# Patient Record
Sex: Female | Born: 1960 | Race: White | Hispanic: No | Marital: Married | Smoking: Current every day smoker
Health system: Southern US, Community
[De-identification: ages and names within clinical notes are randomized; demographics above are authoritative.]

## PROBLEM LIST (undated history)

## (undated) DIAGNOSIS — J449 Chronic obstructive pulmonary disease, unspecified: Secondary | ICD-10-CM

## (undated) DIAGNOSIS — I2510 Atherosclerotic heart disease of native coronary artery without angina pectoris: Secondary | ICD-10-CM

## (undated) DIAGNOSIS — K219 Gastro-esophageal reflux disease without esophagitis: Secondary | ICD-10-CM

## (undated) DIAGNOSIS — Z720 Tobacco use: Secondary | ICD-10-CM

## (undated) DIAGNOSIS — Z972 Presence of dental prosthetic device (complete) (partial): Secondary | ICD-10-CM

## (undated) DIAGNOSIS — I10 Essential (primary) hypertension: Secondary | ICD-10-CM

## (undated) DIAGNOSIS — R7303 Prediabetes: Secondary | ICD-10-CM

## (undated) DIAGNOSIS — Z8489 Family history of other specified conditions: Secondary | ICD-10-CM

## (undated) DIAGNOSIS — K5090 Crohn's disease, unspecified, without complications: Secondary | ICD-10-CM

## (undated) DIAGNOSIS — E785 Hyperlipidemia, unspecified: Secondary | ICD-10-CM

## (undated) HISTORY — PX: CORONARY ANGIOPLASTY: SHX604

## (undated) HISTORY — DX: Essential (primary) hypertension: I10

## (undated) HISTORY — PX: PARTIAL HYSTERECTOMY: SHX80

## (undated) HISTORY — PX: APPENDECTOMY: SHX54

## (undated) HISTORY — DX: Tobacco use: Z72.0

## (undated) HISTORY — PX: CARDIAC CATHETERIZATION: SHX172

---

## 2010-04-24 ENCOUNTER — Emergency Department: Payer: Self-pay | Admitting: Emergency Medicine

## 2013-10-30 DIAGNOSIS — I219 Acute myocardial infarction, unspecified: Secondary | ICD-10-CM

## 2013-10-30 HISTORY — DX: Acute myocardial infarction, unspecified: I21.9

## 2013-11-24 ENCOUNTER — Emergency Department: Payer: Self-pay | Admitting: Emergency Medicine

## 2013-11-24 ENCOUNTER — Inpatient Hospital Stay (HOSPITAL_COMMUNITY)
Admission: AD | Admit: 2013-11-24 | Discharge: 2013-11-26 | DRG: 247 | Disposition: A | Discharge status: Home / Self Care | Payer: MEDICAID | Source: Other Acute Inpatient Hospital | Attending: Cardiovascular Disease | Admitting: Cardiovascular Disease

## 2013-11-24 ENCOUNTER — Encounter (HOSPITAL_COMMUNITY): Payer: Self-pay | Admitting: Cardiology

## 2013-11-24 DIAGNOSIS — F17200 Nicotine dependence, unspecified, uncomplicated: Secondary | ICD-10-CM | POA: Diagnosis present

## 2013-11-24 DIAGNOSIS — I214 Non-ST elevation (NSTEMI) myocardial infarction: Secondary | ICD-10-CM

## 2013-11-24 DIAGNOSIS — R7303 Prediabetes: Secondary | ICD-10-CM | POA: Diagnosis present

## 2013-11-24 DIAGNOSIS — E119 Type 2 diabetes mellitus without complications: Secondary | ICD-10-CM | POA: Diagnosis present

## 2013-11-24 DIAGNOSIS — I213 ST elevation (STEMI) myocardial infarction of unspecified site: Secondary | ICD-10-CM

## 2013-11-24 DIAGNOSIS — J4489 Other specified chronic obstructive pulmonary disease: Secondary | ICD-10-CM | POA: Diagnosis present

## 2013-11-24 DIAGNOSIS — B3731 Acute candidiasis of vulva and vagina: Secondary | ICD-10-CM | POA: Diagnosis present

## 2013-11-24 DIAGNOSIS — I2119 ST elevation (STEMI) myocardial infarction involving other coronary artery of inferior wall: Principal | ICD-10-CM | POA: Diagnosis present

## 2013-11-24 DIAGNOSIS — B373 Candidiasis of vulva and vagina: Secondary | ICD-10-CM | POA: Diagnosis present

## 2013-11-24 DIAGNOSIS — K5090 Crohn's disease, unspecified, without complications: Secondary | ICD-10-CM | POA: Diagnosis present

## 2013-11-24 DIAGNOSIS — J449 Chronic obstructive pulmonary disease, unspecified: Secondary | ICD-10-CM | POA: Diagnosis present

## 2013-11-24 DIAGNOSIS — I249 Acute ischemic heart disease, unspecified: Secondary | ICD-10-CM | POA: Diagnosis present

## 2013-11-24 DIAGNOSIS — I219 Acute myocardial infarction, unspecified: Secondary | ICD-10-CM

## 2013-11-24 DIAGNOSIS — I2589 Other forms of chronic ischemic heart disease: Secondary | ICD-10-CM | POA: Diagnosis present

## 2013-11-24 HISTORY — DX: Prediabetes: R73.03

## 2013-11-24 HISTORY — DX: Crohn's disease, unspecified, without complications: K50.90

## 2013-11-24 HISTORY — DX: Atherosclerotic heart disease of native coronary artery without angina pectoris: I25.10

## 2013-11-24 LAB — BASIC METABOLIC PANEL
ANION GAP: 9 (ref 7–16)
BUN: 10 mg/dL (ref 7–18)
CALCIUM: 9 mg/dL (ref 8.5–10.1)
CO2: 21 mmol/L (ref 21–32)
CREATININE: 1.07 mg/dL (ref 0.60–1.30)
Chloride: 109 mmol/L — ABNORMAL HIGH (ref 98–107)
EGFR (African American): >60
GFR CALC NON AF AMER: 59 — AB
GLUCOSE: 118 mg/dL — AB (ref 65–99)
OSMOLALITY: 278 (ref 275–301)
Potassium: 4.4 mmol/L (ref 3.5–5.1)
Sodium: 139 mmol/L (ref 136–145)

## 2013-11-24 LAB — URINALYSIS, ROUTINE W REFLEX MICROSCOPIC
BILIRUBIN URINE: NEGATIVE
Glucose, UA: NEGATIVE mg/dL
Ketones, ur: NEGATIVE mg/dL
Nitrite: NEGATIVE
Protein, ur: NEGATIVE mg/dL
Specific Gravity, Urine: 1.006 (ref 1.005–1.030)
UROBILINOGEN UA: 0.2 mg/dL (ref 0.0–1.0)
pH: 6 (ref 5.0–8.0)

## 2013-11-24 LAB — CK TOTAL AND CKMB (NOT AT ARMC)
CK TOTAL: 187 U/L — AB (ref 7–177)
CK, MB: 6.3 ng/mL (ref 0.3–4.0)
CK, MB: 23.6 ng/mL (ref 0.3–4.0)
Relative Index: INVALID (ref 0.0–2.5)
Relative Index: 12.6 — ABNORMAL HIGH (ref 0.0–2.5)
Total CK: 70 U/L (ref 7–177)

## 2013-11-24 LAB — CBC
HCT: 39 % (ref 35.0–47.0)
HGB: 13 g/dL (ref 12.0–16.0)
MCH: 29.7 pg (ref 26.0–34.0)
MCHC: 33.4 g/dL (ref 32.0–36.0)
MCV: 89 fL (ref 80–100)
Platelet: 288 10*3/uL (ref 150–440)
RBC: 4.38 10*6/uL (ref 3.80–5.20)
RDW: 15.9 % — AB (ref 11.5–14.5)
WBC: 15.3 10*3/uL — ABNORMAL HIGH (ref 3.6–11.0)

## 2013-11-24 LAB — TROPONIN I
TROPONIN I: 3.36 ng/mL — AB (ref <0.30)
Troponin I: 0.94 ng/mL (ref <0.30)
Troponin I: 4.03 ng/mL (ref <0.30)
Troponin-I: <0.02 ng/mL

## 2013-11-24 LAB — URINE MICROSCOPIC-ADD ON

## 2013-11-24 LAB — HEPARIN LEVEL (UNFRACTIONATED)

## 2013-11-24 LAB — PROTIME-INR
INR: 0.9
Prothrombin Time: 12.4 secs (ref 11.5–14.7)

## 2013-11-24 LAB — MRSA PCR SCREENING: MRSA by PCR: NEGATIVE

## 2013-11-24 LAB — HEMOGLOBIN A1C
HEMOGLOBIN A1C: 5.3 % (ref <5.7)
Mean Plasma Glucose: 105 mg/dL (ref <117)

## 2013-11-24 LAB — APTT: ACTIVATED PTT: 38.4 s — AB (ref 23.6–35.9)

## 2013-11-24 MED ORDER — ASPIRIN EC 81 MG PO TBEC: 81.0000 mg | DELAYED_RELEASE_TABLET | Freq: Every day | ORAL | Status: DC

## 2013-11-24 MED ORDER — SODIUM CHLORIDE 0.9 % IV SOLN: INTRAVENOUS | Status: DC

## 2013-11-24 MED ORDER — CLOTRIMAZOLE 2 % VA CREA
1.0000 | TOPICAL_CREAM | Freq: Every day | VAGINAL | Status: DC
  Filled 2013-11-24: qty 22.2

## 2013-11-24 MED ORDER — NITROGLYCERIN 0.4 MG SL SUBL: 0.4000 mg | SUBLINGUAL_TABLET | SUBLINGUAL | Status: DC | PRN

## 2013-11-24 MED ORDER — NITROGLYCERIN IN D5W 200-5 MCG/ML-% IV SOLN: 3.0000 ug/min | INTRAVENOUS | Status: DC

## 2013-11-24 MED ORDER — SODIUM CHLORIDE 0.9 % IV SOLN: 250.0000 mL | INTRAVENOUS | Status: DC | PRN

## 2013-11-24 MED ORDER — ATORVASTATIN CALCIUM 40 MG PO TABS
40.0000 mg | ORAL_TABLET | Freq: Every day | ORAL | Status: DC
  Administered 2013-11-24 – 2013-11-26 (×3): 40 mg via ORAL
  Filled 2013-11-24 (×3): qty 1

## 2013-11-24 MED ORDER — SODIUM CHLORIDE 0.9 % IV SOLN
INTRAVENOUS | Status: DC
  Administered 2013-11-24: 08:00:00 via INTRAVENOUS

## 2013-11-24 MED ORDER — NITROGLYCERIN IN D5W 200-5 MCG/ML-% IV SOLN
2.0000 ug/min | INTRAVENOUS | Status: DC
  Administered 2013-11-24: 10 ug/min via INTRAVENOUS
  Administered 2013-11-24: 5 ug/min via INTRAVENOUS

## 2013-11-24 MED ORDER — METOPROLOL TARTRATE 12.5 MG HALF TABLET
12.5000 mg | ORAL_TABLET | Freq: Three times a day (TID) | ORAL | Status: DC
  Administered 2013-11-25 – 2013-11-26 (×4): 12.5 mg via ORAL
  Filled 2013-11-24 (×9): qty 1

## 2013-11-24 MED ORDER — NITROGLYCERIN IN D5W 200-5 MCG/ML-% IV SOLN
INTRAVENOUS | Status: AC
  Filled 2013-11-24: qty 250

## 2013-11-24 MED ORDER — MORPHINE SULFATE 2 MG/ML IJ SOLN
2.0000 mg | INTRAMUSCULAR | Status: DC | PRN
  Administered 2013-11-25: 2 mg via INTRAVENOUS
  Filled 2013-11-24 (×2): qty 1

## 2013-11-24 MED ORDER — SODIUM CHLORIDE 0.9 % IJ SOLN: 3.0000 mL | INTRAMUSCULAR | Status: DC | PRN

## 2013-11-24 MED ORDER — SODIUM CHLORIDE 0.9 % IJ SOLN
3.0000 mL | Freq: Two times a day (BID) | INTRAMUSCULAR | Status: DC
  Administered 2013-11-24: 3 mL via INTRAVENOUS

## 2013-11-24 MED ORDER — NITROGLYCERIN 0.4 MG SL SUBL
SUBLINGUAL_TABLET | SUBLINGUAL | Status: AC
  Filled 2013-11-24: qty 25

## 2013-11-24 MED ORDER — CLOTRIMAZOLE 1 % VA CREA
1.0000 | TOPICAL_CREAM | Freq: Every day | VAGINAL | Status: DC
  Administered 2013-11-24 – 2013-11-25 (×2): 1 via VAGINAL
  Filled 2013-11-24: qty 45

## 2013-11-24 MED ORDER — DIAZEPAM 5 MG PO TABS
5.0000 mg | ORAL_TABLET | ORAL | Status: AC
  Administered 2013-11-25: 5 mg via ORAL
  Filled 2013-11-24: qty 1

## 2013-11-24 MED ORDER — MICONAZOLE NITRATE 2 % EX CREA
TOPICAL_CREAM | Freq: Two times a day (BID) | CUTANEOUS | Status: DC
Start: 2013-11-24 — End: 2013-11-26
  Administered 2013-11-24: 1 via TOPICAL
  Administered 2013-11-24 – 2013-11-25 (×2): via TOPICAL
  Filled 2013-11-24: qty 14

## 2013-11-24 MED ORDER — HEPARIN (PORCINE) IN NACL 100-0.45 UNIT/ML-% IJ SOLN
1200.0000 [IU]/h | INTRAMUSCULAR | Status: DC
  Administered 2013-11-24: 750 [IU]/h via INTRAVENOUS
  Filled 2013-11-24 (×2): qty 250

## 2013-11-24 MED ORDER — ACETAMINOPHEN 325 MG PO TABS
650.0000 mg | ORAL_TABLET | ORAL | Status: DC | PRN
  Administered 2013-11-24 (×2): 650 mg via ORAL
  Filled 2013-11-24 (×2): qty 2

## 2013-11-24 MED ORDER — ASPIRIN 81 MG PO CHEW
81.0000 mg | CHEWABLE_TABLET | ORAL | Status: AC
  Administered 2013-11-25: 81 mg via ORAL
  Filled 2013-11-24: qty 1

## 2013-11-24 MED ORDER — PANTOPRAZOLE SODIUM 40 MG PO TBEC
40.0000 mg | DELAYED_RELEASE_TABLET | Freq: Every day | ORAL | Status: DC
  Administered 2013-11-24 – 2013-11-26 (×3): 40 mg via ORAL
  Filled 2013-11-24 (×3): qty 1

## 2013-11-24 MED ORDER — SODIUM CHLORIDE 0.9 % IV SOLN
1.0000 mL/kg/h | INTRAVENOUS | Status: DC
  Administered 2013-11-25: 1 mL/kg/h via INTRAVENOUS

## 2013-11-24 NOTE — Progress Notes (Signed)
[facility] paged for trop value of  0.94

## 2013-11-24 NOTE — Progress Notes (Signed)
Patient's followup troponin was 0.94. Presentation therefore consistent with an acute coronary syndrome. Continue aspirin, heparin, beta blocker and statin. Continue nitroglycerin. She is pain-free at present. Followup electrocardiogram shows sinus bradycardia with no  ST elevation. Preliminary echocardiogram previously showed normal LV function and wall motion.Clinically she has reperfused following thrombolytic therapy. Therefore we will plan continue medical therapy and proceed with cardiac catheterization tomorrow morning. The risks and benefits were discussed and the patient agrees to proceed. We will proceed emergently if she develops recurrent symptoms despite therapy. [name]

## 2013-11-24 NOTE — Consult Note (Signed)
ANTICOAGULATION CONSULT NOTE - Initial Consult  Pharmacy Consult for heparin Indication: chest pain/ACS  No Known Allergies  Patient Measurements: Height: 5\' 2"  (157.5 cm) Weight: 158 lb 6.4 oz (71.85 kg) IBW/kg (Calculated) : 50.1 Heparin Dosing Weight: 65.4  Vital Signs: Temp: 97.7 F (36.5 C) (02/26 0800) Temp src: Oral (02/26 0800) BP: 103/65 mmHg (02/26 0910) Pulse Rate: 57 (02/26 0910)  Labs: No results found for this basename: HGB, HCT, PLT, APTT, LABPROT, INR, HEPARINUNFRC, CREATININE, CKTOTAL, CKMB, TROPONINI,  in the last 72 hours  CrCl is unknown because no creatinine reading has been taken.   Medical History: Past Medical History  Diagnosis Date  . Borderline type 2 diabetes mellitus 11/24/2013  . Crohn's disease 11/24/2013    Medications:  Scheduled:  . [START ON 11/25/2013] aspirin EC  81 mg Oral Daily  . atorvastatin  40 mg Oral q1800  . metoprolol tartrate  12.5 mg Oral TID  . nitroGLYCERIN      . pantoprazole  40 mg Oral Daily    Assessment: 53 yo F initially presented to [facility] with some inferior wall ST elevation, and was given TPA. Patient was subsequently transferred to [facility] with continued chest pain. Pharmacy has been consulted to dose heparin for ACS.  Will remain conservative in dosing as patient recently received a thrombolytic. Will aim for a lower goal today (0.3-0.5) and adjust per levels, though as time progresses from TPA administration, we can aim for the typical goal of 0.3 - 0.7. (Fibrinolytic activity persists for up to 1 hour after infusion ends per LexiComp).    No CBC or BMET available.  Goal of Therapy:  Tentatively will aim for heparin level 0.3-0.5 units/mL Heparin level 0.3-0.7 units/ml Monitor platelets by anticoagulation protocol: Yes   Plan:  -Start heparin without bolus at 750 units/hr (~11.4 units/kg/hr using dosing weight) -Check 8 hour heparin level (since no bolus given) at 1730 -Daily HL, CBC  while on heparin -F/u bleeding complications, cardiology plans  Edouard Gikas C. Hamna Asa, PharmD Clinical Pharmacist-Resident Pager: [phone] Pharmacy: [phone] 11/24/2013 9:31 AM

## 2013-11-24 NOTE — Progress Notes (Signed)
Pt and spouse  are watching cardiac cath video ( # 115) .

## 2013-11-24 NOTE — Progress Notes (Signed)
  Echocardiogram 2D Echocardiogram has been performed.  [facility] 11/24/2013, 8:48 AM

## 2013-11-24 NOTE — Progress Notes (Addendum)
Troponin value = 3.36, CK-MB = 23.6 , total CK  = 187 . Pt sleeping . B/P = 89/58(68) . HR sustained at 56 (SR) .SpO2 = 97% on  RA .  Pt had been denying chest discomfort . Eating well . Voided x 3 this shift iIn  [facility] . [name] ,NP , paged .

## 2013-11-24 NOTE — Progress Notes (Signed)
For vag. Yeast infections that pt has been having will add monistat vag suppos. And ointment to groin.  Will check hgBA1c, for freq yeast inf. And borderline diabetes.

## 2013-11-24 NOTE — Progress Notes (Signed)
Pt c/o having yeast infection ." I've been getting them a lot . Dr told me to change soaps . The Dr  Usually gives me a pill to take ." Lady Saucier , paged .

## 2013-11-24 NOTE — Progress Notes (Signed)
Pt had watched cardiac cath video for the 2nd time . Instructions for pre and post procedure also verbally reviewed. the patient asked appropriate questions prior to signing consent form.

## 2013-11-24 NOTE — H&P (Signed)
[facility] Hall is an 53 y.o. female.    Primary Cardiologist:NEW  Default, Provider, MD  Chief Complaint: Chest pain   HPI: 53 year old female with 6 month hx of episodic chest pain but increased this AM with radiation down arm.  She went to [facility] and had some inf wall ST elevation and rec'd TPA.  Associated symptoms were nausea and diaphoresis.   CXR with atelectasis.   Labs: WBC 15.3, hgb 13, hct 39,  Cr. 1.07   [facility] and here with continued chest pain.  On arrival BP 179/100 and IV NTG started.  She has 5/10 chest pain.    Past Medical History  Diagnosis Date  . Borderline type 2 diabetes mellitus 11/24/2013  . Crohn's disease 11/24/2013    Past Surgical History  Procedure Laterality Date  . Partial hysterectomy    . Appendectomy    . Cesarean section      X 2    Family History  Problem Relation Age of Onset  . Hypertension Mother   . Diabetes Mother   . Stroke Maternal Grandfather    Social History:  reports that she has been smoking Cigarettes.  She has been smoking about 0.00 packs per day. She has never used smokeless tobacco. She reports that she does not drink alcohol or use illicit drugs.  Allergies: No Known Allergies  OUTPATIENT Meds:   ASA  No results found for this or any previous visit (from the past 48 hour(s)). No results found.  ROS: General:no colds or fevers, no weight changes Skin:no rashes or ulcers HEENT:no blurred vision, no congestion CV:see HPI PUL:see HPI GI:no diarrhea constipation occ melena with her Crohns, no indigestion GU:no hematuria, no dysuria MS:no joint pain, no claudication, + rt foot pain yesterday, had trouble walking Neuro:no syncope, no lightheadedness Endo:borderline diabetes, no thyroid disease   Blood pressure 153/90, pulse 58, temperature 97.5 F (36.4 C), temperature source Oral, resp. rate 17, height 5\' 2"  (1.575 m), weight 158 lb 6.4 oz (71.85 kg), SpO2 99.00%. PE:  General:anxious, in pain, some sedation Skin:Warm and dry, brisk capillary refill HEENT:normocephalic, sclera clear, mucus membranes moist Neck:supple, no JVD, no bruits  Heart:S1S2 RRR without murmur, gallup, rub or click Lungs:clear without rales, rhonchi, or wheezes XBJ:YNWGAbd:soft, non tender, + BS, do not palpate liver spleen or masses Ext:no lower ext edema, 2+ pedal pulses, 2+ radial pulses Neuro:alert and oriented, MAE, follows commands, + facial symmetry    Assessment/Plan Principal Problem:   STEMI (ST elevation myocardial infarction), inf wall Active Problems:   Borderline type 2 diabetes mellitus   Crohn's disease   Plan: stat echo, enzymes.  On NTGNada Boozer.    [name],[name] Nurse Practitioner Certified [facility] Pager [phone] or after 5pm or weekends call [phone] 11/24/2013, 8:11 AM    As above, patient seen and examined. Briefly she is a 53 year old female with a past medical history of COPD, borderline diabetes mellitus, Crohn's disease transferred from [facility] with possible inferior myocardial infarction. No prior cardiac history. Patient states she has had occasional chest pain since beginning Chantix 6 months ago ([facility] The pain occurs both with exertion and at rest. It is in the left breast area radiating to the neck and left upper extremity. Lasts 10 minutes and resolves spontaneously. Developed recurrent chest pain at approximately 2 AM. She describes the pain as sharp and dull radiating to neck  and left upper extremity. Increases with certain movements. There is associated nausea, diaphoresis and dyspnea. Went to [facility]. Patient was seen in the emergency room and by report electrocardiogram was felt to be consistent with an acute inferior myocardial infarction. Because of the weather she was unable to be transferred immediately to [facility]. Because of the potential delay in transfer she was treated with  thrombolytic therapy. She was ultimately able to be transferred by EMS and arrived at [facility] at approximately 6:30 AM. On arrival she was having chest pain but then symptoms resolved. Exam shows 2 + pulses in all ext; chest CTA; CV RRR with no murmurs. I have reviewed electrocardiograms. There appears to be slight ST elevation inferiorly but I am not convinced it is diagnostic of an acute infarct. Her pain is also somewhat unusual as it increases with certain movements. Question musculoskeletal. I would be hesitant to proceed with cardiac catheterization at this point given uncertainty of diagnosis and the risk of bleeding in the lytic state. We will cycle enzymes. If abnormal we'll plan to proceed with cardiac catheterization tomorrow morning. If normal would ultimately plan stress nuclear study. Quick look echocardiogram showed normal LV function and no pericardial effusion. No wall motion abnormalities. Will discontinue heparin if followup enzymes negative. Watch for bleeding. Discontinue tobacco use. [name]

## 2013-11-24 NOTE — Plan of Care (Signed)
Problem: Consults Goal: Chest Pain Patient Education (See Patient Education module for education specifics.) Outcome: Progressing Pt watched cardiac cath video

## 2013-11-24 NOTE — Care Management Note (Addendum)
    Page 1 of 1   11/25/2013     2:20:43 PM   CARE MANAGEMENT NOTE 11/25/2013  Patient:  DEMIKA, MARIAS   Account Number:  [id number]  Date Initiated:  11/24/2013  Documentation initiated by:  Junius Creamer  Subjective/Objective Assessment:   adm w st changes     Action/Plan:   lives w husband   Anticipated DC Date:     Anticipated DC Plan:        DC Planning Services  CM consult  Medication Assistance      Choice offered to / List presented to:             Status of service:   Medicare Important Message given?   (If response is "NO", the following Medicare IM given date fields will be blank) Date Medicare IM given:   Date Additional Medicare IM given:    Discharge Disposition:    Per UR Regulation:  Reviewed for med. necessity/level of care/duration of stay  If discussed at Long Length of Stay Meetings, dates discussed:    Comments:  2/27 1420 debbie Inaara Tye rn,bsn left pt 30day free and copay assist card for effient.

## 2013-11-24 NOTE — Progress Notes (Signed)
11/24/13  Pharmacy- Heparin 1920  Heparin level < 0.1 (goal 0.3-0.5 x 1st 24hr s/p tPA)  A/P:  53yo female with ACS, s/p tPA at [facility] last night.  Heparin level is sub-therapeutic, no problems with IV nor with IV pump.  No bleeding issues per d/w RN.    1.  Increase heparin to 900 units/hr 2.  Heparin level in 6hr  Marisue Humble, PharmD Clinical Pharmacist [facility]

## 2013-11-25 ENCOUNTER — Encounter (HOSPITAL_COMMUNITY)
Admission: AD | Disposition: A | Discharge status: Home / Self Care | Payer: Self-pay | Source: Other Acute Inpatient Hospital | Attending: Cardiovascular Disease

## 2013-11-25 ENCOUNTER — Other Ambulatory Visit: Payer: Self-pay

## 2013-11-25 ENCOUNTER — Encounter (HOSPITAL_COMMUNITY): Payer: Self-pay | Admitting: Interventional Cardiology

## 2013-11-25 DIAGNOSIS — I200 Unstable angina: Secondary | ICD-10-CM

## 2013-11-25 DIAGNOSIS — E119 Type 2 diabetes mellitus without complications: Secondary | ICD-10-CM

## 2013-11-25 DIAGNOSIS — I2119 ST elevation (STEMI) myocardial infarction involving other coronary artery of inferior wall: Principal | ICD-10-CM

## 2013-11-25 DIAGNOSIS — I219 Acute myocardial infarction, unspecified: Secondary | ICD-10-CM

## 2013-11-25 DIAGNOSIS — I2510 Atherosclerotic heart disease of native coronary artery without angina pectoris: Secondary | ICD-10-CM

## 2013-11-25 HISTORY — PX: LEFT HEART CATHETERIZATION WITH CORONARY ANGIOGRAM: SHX5451

## 2013-11-25 HISTORY — PX: PERCUTANEOUS CORONARY STENT INTERVENTION (PCI-S): SHX5485

## 2013-11-25 HISTORY — PX: FRACTIONAL FLOW RESERVE WIRE: SHX5839

## 2013-11-25 LAB — HEPARIN LEVEL (UNFRACTIONATED)

## 2013-11-25 LAB — BASIC METABOLIC PANEL
BUN: 11 mg/dL (ref 6–23)
CHLORIDE: 108 meq/L (ref 96–112)
CO2: 23 meq/L (ref 19–32)
Calcium: 8.4 mg/dL (ref 8.4–10.5)
Creatinine, Ser: 0.76 mg/dL (ref 0.50–1.10)
GFR calc non Af Amer: >90 mL/min (ref >90)
Glucose, Bld: 107 mg/dL — ABNORMAL HIGH (ref 70–99)
Potassium: 4 mEq/L (ref 3.7–5.3)
Sodium: 143 mEq/L (ref 137–147)

## 2013-11-25 LAB — POCT ACTIVATED CLOTTING TIME
ACTIVATED CLOTTING TIME: 232 s
ACTIVATED CLOTTING TIME: 298 s
Activated Clotting Time: 249 seconds
Activated Clotting Time: 260 seconds

## 2013-11-25 LAB — LIPID PANEL
CHOL/HDL RATIO: 4.9 ratio
Cholesterol: 148 mg/dL (ref 0–200)
Cholesterol: 151 mg/dL (ref 0–200)
HDL: 30 mg/dL — ABNORMAL LOW (ref >39)
HDL: 31 mg/dL — AB (ref >39)
LDL CALC: 73 mg/dL (ref 0–99)
LDL Cholesterol: 71 mg/dL (ref 0–99)
TRIGLYCERIDES: 234 mg/dL — AB (ref <150)
Total CHOL/HDL Ratio: 4.9 RATIO
Triglycerides: 236 mg/dL — ABNORMAL HIGH (ref <150)
VLDL: 47 mg/dL — AB (ref 0–40)
VLDL: 47 mg/dL — ABNORMAL HIGH (ref 0–40)

## 2013-11-25 LAB — CBC
HCT: 33.3 % — ABNORMAL LOW (ref 36.0–46.0)
Hemoglobin: 11.3 g/dL — ABNORMAL LOW (ref 12.0–15.0)
MCH: 30.1 pg (ref 26.0–34.0)
MCHC: 33.9 g/dL (ref 30.0–36.0)
MCV: 88.6 fL (ref 78.0–100.0)
Platelets: 244 10*3/uL (ref 150–400)
RBC: 3.76 MIL/uL — AB (ref 3.87–5.11)
RDW: 14.8 % (ref 11.5–15.5)
WBC: 8.4 10*3/uL (ref 4.0–10.5)

## 2013-11-25 LAB — PROTIME-INR
INR: 0.99 (ref 0.00–1.49)
PROTHROMBIN TIME: 12.9 s (ref 11.6–15.2)

## 2013-11-25 LAB — PLATELET COUNT: Platelets: 265 10*3/uL (ref 150–400)

## 2013-11-25 SURGERY — LEFT HEART CATHETERIZATION WITH CORONARY ANGIOGRAM
Anesthesia: LOCAL | Site: Hand | Laterality: Right

## 2013-11-25 MED ORDER — TIROFIBAN HCL IV 5 MG/100ML
0.1500 ug/kg/min | INTRAVENOUS | Status: AC
  Filled 2013-11-25: qty 100

## 2013-11-25 MED ORDER — HEPARIN (PORCINE) IN NACL 2-0.9 UNIT/ML-% IJ SOLN
INTRAMUSCULAR | Status: AC
  Filled 2013-11-25: qty 1500

## 2013-11-25 MED ORDER — ACETAMINOPHEN 500 MG PO TABS
500.0000 mg | ORAL_TABLET | Freq: Four times a day (QID) | ORAL | Status: DC | PRN
  Administered 2013-11-25: 500 mg via ORAL
  Filled 2013-11-25: qty 1

## 2013-11-25 MED ORDER — LIDOCAINE HCL (PF) 1 % IJ SOLN
INTRAMUSCULAR | Status: AC
  Filled 2013-11-25: qty 30

## 2013-11-25 MED ORDER — VERAPAMIL HCL 2.5 MG/ML IV SOLN
INTRAVENOUS | Status: AC
  Filled 2013-11-25: qty 2

## 2013-11-25 MED ORDER — PRASUGREL HCL 10 MG PO TABS
ORAL_TABLET | ORAL | Status: AC
  Filled 2013-11-25: qty 6

## 2013-11-25 MED ORDER — MIDAZOLAM HCL 2 MG/2ML IJ SOLN
INTRAMUSCULAR | Status: AC
  Filled 2013-11-25: qty 2

## 2013-11-25 MED ORDER — ASPIRIN EC 81 MG PO TBEC: 81.0000 mg | DELAYED_RELEASE_TABLET | Freq: Every day | ORAL | Status: DC

## 2013-11-25 MED ORDER — NITROGLYCERIN 0.2 MG/ML ON CALL CATH LAB
INTRAVENOUS | Status: AC
  Filled 2013-11-25: qty 1

## 2013-11-25 MED ORDER — FENTANYL CITRATE 0.05 MG/ML IJ SOLN
INTRAMUSCULAR | Status: AC
  Filled 2013-11-25: qty 2

## 2013-11-25 MED ORDER — ASPIRIN 325 MG PO TABS
325.0000 mg | ORAL_TABLET | Freq: Every day | ORAL | Status: DC
  Administered 2013-11-26: 325 mg via ORAL
  Filled 2013-11-25 (×2): qty 1

## 2013-11-25 MED ORDER — TIROFIBAN HCL IV 12.5 MG/250 ML
INTRAVENOUS | Status: AC
  Filled 2013-11-25: qty 250

## 2013-11-25 MED ORDER — HEPARIN SODIUM (PORCINE) 1000 UNIT/ML IJ SOLN
INTRAMUSCULAR | Status: AC
  Filled 2013-11-25: qty 1

## 2013-11-25 MED ORDER — SODIUM CHLORIDE 0.9 % IV SOLN: 1.0000 mL/kg/h | INTRAVENOUS | Status: AC

## 2013-11-25 MED ORDER — ASPIRIN 81 MG PO CHEW: 81.0000 mg | CHEWABLE_TABLET | Freq: Every day | ORAL | Status: DC

## 2013-11-25 MED ORDER — ADENOSINE 12 MG/4ML IV SOLN
12.0000 mL | INTRAVENOUS | Status: AC
  Administered 2013-11-25: 36 mg via INTRAVENOUS
  Filled 2013-11-25: qty 12

## 2013-11-25 MED ORDER — PRASUGREL HCL 10 MG PO TABS
10.0000 mg | ORAL_TABLET | Freq: Every day | ORAL | Status: DC
  Administered 2013-11-25 – 2013-11-26 (×2): 10 mg via ORAL
  Filled 2013-11-25 (×2): qty 1

## 2013-11-25 NOTE — Progress Notes (Signed)
Subjective: [name] is a 53 yo F pmh COPD, Crohns, DM transfer from [facility] w/STEMI s/p TPA.   This AM pt is w/o CP, SOB, DOE anxious for LHC on 11/25/13. More history pt started smoking at age 34 and up to 4ppd at some years but has been trying to quit and down to 0.5ppd currently. Pt has never had any periods of smoking cessation.   Objective: Vital signs in last 24 hours: Filed Vitals:   11/25/13 0400 11/25/13 0500 11/25/13 0600 11/25/13 0700  BP: 117/65 98/58 95/53  104/68  Pulse: 84     Temp: 98.2 F (36.8 C)     TempSrc: Oral     Resp: 14     Height:      Weight:   156 lb 8.4 oz (71 kg)   SpO2: 100%      Weight change: -1 lb 14 oz (-0.85 kg)  Intake/Output Summary (Last 24 hours) at 11/25/13 0733 Last data filed at 11/25/13 0650  Gross per 24 hour  Intake 2005.53 ml  Output   3375 ml  Net -1369.47 ml   General: resting in bed HEENT: PERRL, EOMI, no scleral icterus Cardiac: RRR, no rubs, murmurs or gallops Pulm: clear to auscultation bilaterally, moving normal volumes of air Abd: soft, nontender, nondistended, BS present Ext: warm and well perfused, no pedal edema Neuro: alert and oriented X3, cranial nerves II-XII grossly intact  Lab Results: Basic Metabolic Panel:  Recent Labs Lab 11/25/13 0210  NA 143  K 4.0  CL 108  CO2 23  GLUCOSE 107*  BUN 11  CREATININE 0.76  CALCIUM 8.4   CBC:  Recent Labs Lab 11/25/13 0210  WBC 8.4  HGB 11.3*  HCT 33.3*  MCV 88.6  PLT 244   Cardiac Enzymes:  Recent Labs Lab 11/24/13 0818 11/24/13 1410 11/24/13 2120  CKTOTAL 70 187*  --   CKMB 6.3* 23.6*  --   TROPONINI 0.94* 3.36* 4.03*   Hemoglobin A1C:  Recent Labs Lab 11/24/13 1700  HGBA1C 5.3   Fasting Lipid Panel:  Recent Labs Lab 11/25/13 0210  CHOL 151  148  HDL 31*  30*  LDLCALC 73  71  TRIG 234*  236*  CHOLHDL 4.9  4.9   Coagulation:  Recent Labs Lab 11/25/13 0210  LABPROT 12.9  INR 0.99   Urinalysis:  Recent  Labs Lab 11/24/13 1730  COLORURINE YELLOW  LABSPEC 1.006  PHURINE 6.0  GLUCOSEU NEGATIVE  HGBUR SMALL*  BILIRUBINUR NEGATIVE  KETONESUR NEGATIVE  PROTEINUR NEGATIVE  UROBILINOGEN 0.2  NITRITE NEGATIVE  LEUKOCYTESUR LARGE*   Cardiac Studies: Echo 11/24/13: EF 40-45%, Possible mild hypokinesis of the basalinferior myocardium. Left ventricular diastolic function parameters were normal.   Medications: I have reviewed the patient's current medications. Scheduled Meds: . [START ON 11/26/2013] aspirin EC  81 mg Oral Daily  . atorvastatin  40 mg Oral q1800  . clotrimazole  1 Applicatorful Vaginal QHS  . metoprolol tartrate  12.5 mg Oral TID  . miconazole   Topical BID  . pantoprazole  40 mg Oral Daily  . sodium chloride  3 mL Intravenous Q12H   Continuous Infusions: . sodium chloride Stopped (11/24/13 2000)  . sodium chloride    . sodium chloride 1 mL/kg/hr (11/25/13 0654)  . heparin Stopped (11/25/13 0650)  . nitroGLYCERIN 10 mcg/min (11/24/13 0752)  . nitroGLYCERIN 5 mcg/min (11/24/13 0840)   PRN Meds:.sodium chloride, acetaminophen, morphine injection, nitroGLYCERIN, sodium chloride  Echo 11/24/2013  Left ventricle: The  cavity size was normal. Wall thickness was normal. Systolic function was mildly to moderately reduced. The estimated ejection fraction was in the range of 40% to 45%. Possible mild hypokinesis of the basalinferior myocardium. Left ventricular diastolic function parameters were normal.    Assessment/Plan:  1. Acute Coronary Syndrome: Pt p/w ongoing CP, suspecis EKG findings of inferior MI, trop elevation (peak 4.03).  -LHC 11/25/13 -ASA, metoprolol, statin  2. Crohn's Disease w/o flare  3. DM type 2: Pt HgbA1c 5.3 therefore pre-DM. Life style changes advised.   4. Vaginal candidiasis: on monistat therapy   5. Tobacco abuse: smoking cessation education and access to materials provided to pt. May consider using Chantix again pt will address possibility  with PCP.    LOS: 1 day   Alejandra BameNora Sadek, MD 11/25/2013, 7:33 AM   The patient was seen, examined and discussed with Alejandra BameNora Sadek, MD and I agree with the above.   In summary , a 53 year old female who presented on 11/23/13 with STEMI, s/p TPA, but worsening troponin, mas troponin 4.03 and rising, cath is scheduled for today. Echo showed LVEF 40-45% with regional wall motion abnormalities. We will continue metoprolol, ASA, add atorvastatin, add ACEI after cath if tolerated by BP.   [facility], [facility], [facility] 11/25/2013

## 2013-11-25 NOTE — CV Procedure (Addendum)
PROCEDURE:  Left heart catheterization with selective coronary angiography, left ventriculogram.  FFR of the LAD. PCI of the LAD. PCI of the RCA.  INDICATIONS:  Acute inferior myocardial infarction, treated initially with thrombolytics  The risks, benefits, and details of the procedure were explained to the patient.  The patient verbalized understanding and wanted to proceed.  Informed written consent was obtained.  PROCEDURE TECHNIQUE:  After Xylocaine anesthesia a 14F slender sheath was placed in the right radial artery with a single anterior needle wall stick.   Intravenous heparin was administered. Right coronary angiography was done using a Judkins R4 guide catheter.  Left coronary angiography was done using a Judkins L3.5 guide catheter.  Left ventriculography was done using a pigtail catheter. Pressure wire was performed with additional heparin. Intravenous adenosine was given as well for the pressure wire. Intervention was performed after tirofiban was given. Please see below for details. A TR band was used for hemostasis.   CONTRAST:  Total of 230 cc.  COMPLICATIONS:  None.    HEMODYNAMICS:  Aortic pressure was 105/63; LV pressure was 109/4; LVEDP 12.  There was no gradient between the left ventricle and aorta.    ANGIOGRAPHIC DATA:   The left main coronary artery is widely patent.  The left anterior descending artery is a large vessel which wraps around the apex. In the proximal vessel, there is an eccentric 60-70% stenosis. There is mild disease in the mid vessel. There is a large diagonal branch which has mild disease. The remainder of the mid to distal LAD past the diagonal is widely patent with only mild atherosclerosis.    The left circumflex artery is a large vessel proximally. The first obtuse marginal is small but widely patent. The second obtuse marginal is patent proximally. There is an aneurysmal segment just before bifurcation. The more anterior pole is widely  patent. The more lateral pole is occluded proximally. This fills by left to left collaterals. The vessel appears small in caliber but is fairly long.  The right coronary artery is a large dominant vessel. There is moderate disease proximally. In the mid vessel, there is moderate to severe diffuse disease. There is a focal 70% stenosis, followed by a tortuous segment. After the tortuous segment, there is a focal 90% stenosis. The distal RCA appears widely patent. The posterior descending artery is large and widely patent. The posterior lateral artery is medium size and widely patent.  LEFT VENTRICULOGRAM:  Left ventricular angiogram was done in the 30 RAO projection and revealed normal left ventricular wall motion and systolic function with an estimated ejection fraction of 60 %.  LVEDP was 12 mmHg.  PCI NARRATIVE: A CLS 3.0 guiding catheter was used to engage the left main. Additional IV heparin was given for anticoagulation. An ACT was used to check that the anticoagulation was therapeutic. A pressure wire was placed across the area disease in the proximal LAD.  IV adenosine was given. The resting FFR was 0.94. The FFR after adenosine was 0.80.  At that point, it was unclear whether the occluded OM branch was the culprit for her infarct. The pressure wire was removed and a pro-water wire was advanced to the circumflex. We attempted to cross the occlusion in the OM but this would not cross. 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 The pro-water wire was then directed down the LAD. The proximal LAD lesion which had been found significant by pressure wire  was direct stented with a 3.5 x 12 from his drug-eluting stent, postdilated with a 3.75 x 8 noncompliant balloon. Intracoronary nitroglycerin was administered.  There was an excellent angiographic result.  Attention was then turned to the RCA. Initially, a Williams right guiding catheter was used. The wire was placed down the  vessel despite the fact that the [facility] right did not quite reach the ostium. Even with the wire across the lesion, the guide could not be pulled in. We switched to an Akari right 1.0 guide.  A Fielder XT wire was placed across the area disease in the RCA. A 2.0 x 20 balloon was advanced but would not cross. A pro-water wire was then placed across the area disease as a buddy wire. A 2.0 balloon did advance. This was used to predilate the RCA.  We tried to advance a 2.5 x 38 promus drug-eluting stent, but this would not advance.  A guideline or was then advanced with a 2.0 x 12 balloon in front of it. This was advanced to the mid RCA. It went into position in the mid RCA. It was deployed at high pressure. The guideliner was removed.  There is difficulty in getting a post dilatation balloon to advance to post-dilate the distal stent. Several balloons were tried. The proximal area to the stent also had moderate disease. This was subsequently stented with a 2.75 x 16 promise drug-eluting stent in overlapping fashion. This area was eventually postdilated with a 3.25 noncompliant balloon; however this balloon did not advance to the distal vessel. A grand slam wire was used as a buddy wire without success. Eventually, another pro-water wire was placed. A 3.0 x 8 noncompliant balloon was advanced to the distal stented area. After significant effort, the entire stented segment was post dilated. The postdilatation balloons were inflated to high pressure. There is an excellent angiographic result.  IMPRESSIONS:  1. Widely patent left main coronary artery. 2. Successful PCI of the proximal left anterior descending artery 3.5 x 12 Promus drug-eluting stent, postdilated to 3.8 mm in diameter 3. Patent left circumflex artery.  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 4. Successful, but complicated PCI of right coronary artery, requiring guideliner, with a 2.5 x 38 Promus drug-eluting  stent, overlapping with a 2.75 x 16 promus drug-eluting stent more proximally. The entire stented segment was post dilated with a 3.0  noncompliant balloon distally and a 3.25 noncompliant balloon more proximally. 5. Normal left ventricular systolic function.  LVEDP 12 mmHg.  Ejection fraction 60%.  RECOMMENDATION:  Dual antiplatelet therapy for at least a year. The patient be watched overnight in the step down unit. Possible discharge in the next one to 2 days depending on how she does and how ambulatory she is without chest pain. Continue aggressive secondary prevention.

## 2013-11-25 NOTE — H&P (View-Only) (Signed)
Patient's followup troponin was 0.94. Presentation therefore consistent with an acute coronary syndrome. Continue aspirin, heparin, beta blocker and statin. Continue nitroglycerin. She is pain-free at present. Followup electrocardiogram shows sinus bradycardia with no  ST elevation. Preliminary echocardiogram previously showed normal LV function and wall motion.Clinically she has reperfused following thrombolytic therapy. Therefore we will plan continue medical therapy and proceed with cardiac catheterization tomorrow morning. The risks and benefits were discussed and the patient agrees to proceed. We will proceed emergently if she develops recurrent symptoms despite therapy. [facility]

## 2013-11-25 NOTE — Interval H&P Note (Signed)
Cath Lab Visit (complete for each Cath Lab visit)  Clinical Evaluation Leading to the Procedure:   ACS: yes  Non-ACS:    Anginal Classification: CCS IV  Anti-ischemic medical therapy: Minimal Therapy (1 class of medications)  Non-Invasive Test Results: No non-invasive testing performed  Prior CABG: No previous CABG      History and Physical Interval Note:  11/25/2013 8:37 AM  [facility]  has presented today for surgery, with the diagnosis of cp  The various methods of treatment have been discussed with the patient and family. After consideration of risks, benefits and other options for treatment, the patient has consented to  Procedure(s): LEFT HEART CATHETERIZATION WITH CORONARY ANGIOGRAM (N/A) as a surgical intervention .  The patient's history has been reviewed, patient examined, no change in status, stable for surgery.  I have reviewed the patient's chart and labs.  Questions were answered to the patient's satisfaction.     Kajuana Shareef S.

## 2013-11-25 NOTE — Progress Notes (Signed)
ANTICOAGULATION CONSULT NOTE - Follow Up Consult  Pharmacy Consult for heparin Indication: chest pain/ACS  Labs:  Recent Labs  11/24/13 0818 11/24/13 1410 11/24/13 1700 11/24/13 2120 11/25/13 0210  HGB  --   --   --   --  11.3*  HCT  --   --   --   --  33.3*  PLT  --   --   --   --  244  LABPROT  --   --   --   --  12.9  INR  --   --   --   --  0.99  HEPARINUNFRC  --   --  <0.10*  --  <0.10*  CREATININE  --   --   --   --  0.76  CKTOTAL 70 187*  --   --   --   CKMB 6.3* 23.6*  --   --   --   TROPONINI 0.94* 3.36*  --  4.03*  --     Assessment: 52yo female remains undetectable on heparin despite rate increase w/ conservative dosing given tPA at OSH, now >24hr since administration and can increase goal.  Goal of Therapy:  Heparin level 0.3-0.7 units/ml   Plan:  Will increase heparin gtt by 4 units/kg/hr to 1200 units/hr and check level in 6hr.  Vernard Gambles, PharmD, BCPS  11/25/2013,4:42 AM

## 2013-11-26 ENCOUNTER — Encounter (HOSPITAL_COMMUNITY): Payer: Self-pay | Admitting: Cardiology

## 2013-11-26 LAB — CBC
HCT: 31.8 % — ABNORMAL LOW (ref 36.0–46.0)
Hemoglobin: 10.9 g/dL — ABNORMAL LOW (ref 12.0–15.0)
MCH: 30.3 pg (ref 26.0–34.0)
MCHC: 34.3 g/dL (ref 30.0–36.0)
MCV: 88.3 fL (ref 78.0–100.0)
PLATELETS: 236 10*3/uL (ref 150–400)
RBC: 3.6 MIL/uL — AB (ref 3.87–5.11)
RDW: 14.7 % (ref 11.5–15.5)
WBC: 10.7 10*3/uL — AB (ref 4.0–10.5)

## 2013-11-26 LAB — BASIC METABOLIC PANEL
BUN: 12 mg/dL (ref 6–23)
CHLORIDE: 106 meq/L (ref 96–112)
CO2: 24 meq/L (ref 19–32)
Calcium: 8.5 mg/dL (ref 8.4–10.5)
Creatinine, Ser: 0.85 mg/dL (ref 0.50–1.10)
GFR calc Af Amer: 89 mL/min — ABNORMAL LOW (ref >90)
GFR calc non Af Amer: 77 mL/min — ABNORMAL LOW (ref >90)
Glucose, Bld: 100 mg/dL — ABNORMAL HIGH (ref 70–99)
Potassium: 3.9 mEq/L (ref 3.7–5.3)
SODIUM: 142 meq/L (ref 137–147)

## 2013-11-26 MED ORDER — ASPIRIN 81 MG PO TABS: 81.0000 mg | ORAL_TABLET | Freq: Every day | ORAL | Status: DC

## 2013-11-26 MED ORDER — PANTOPRAZOLE SODIUM 40 MG PO TBEC: 40.0000 mg | DELAYED_RELEASE_TABLET | Freq: Every day | ORAL | Status: DC

## 2013-11-26 MED ORDER — LISINOPRIL 2.5 MG PO TABS
2.5000 mg | ORAL_TABLET | Freq: Every day | ORAL | Status: DC
  Administered 2013-11-26: 2.5 mg via ORAL
  Filled 2013-11-26: qty 1

## 2013-11-26 MED ORDER — ASPIRIN 81 MG PO CHEW: 81.0000 mg | CHEWABLE_TABLET | Freq: Every day | ORAL | Status: DC

## 2013-11-26 MED ORDER — PRASUGREL HCL 10 MG PO TABS: 10.0000 mg | ORAL_TABLET | Freq: Every day | ORAL | Status: DC

## 2013-11-26 MED ORDER — CARVEDILOL 3.125 MG PO TABS: 3.1250 mg | ORAL_TABLET | Freq: Two times a day (BID) | ORAL | Status: DC

## 2013-11-26 MED ORDER — ATORVASTATIN CALCIUM 80 MG PO TABS: 80.0000 mg | ORAL_TABLET | Freq: Every day | ORAL | Status: DC

## 2013-11-26 MED ORDER — CARVEDILOL 3.125 MG PO TABS
3.1250 mg | ORAL_TABLET | Freq: Two times a day (BID) | ORAL | Status: DC
  Administered 2013-11-26: 3.125 mg via ORAL
  Filled 2013-11-26 (×2): qty 1

## 2013-11-26 MED ORDER — LISINOPRIL 2.5 MG PO TABS: 2.5000 mg | ORAL_TABLET | Freq: Every day | ORAL | Status: DC

## 2013-11-26 MED ORDER — WHITE PETROLATUM GEL
Status: AC
  Administered 2013-11-26: 0.2
  Filled 2013-11-26: qty 5

## 2013-11-26 NOTE — Progress Notes (Signed)
CARDIAC REHAB PHASE I   PRE:  Rate/Rhythm: 81   BP:  Sitting: 97/67     SaO2: 97% ra  MODE:  Ambulation: 700 ft   POST:  Rate/Rhythm: 98  BP:  Sitting: 101/66     SaO2: 98% ra  2:15PM-3:00PM Alejandra Hall walked at a quick steady pace independently.  Patient is interested in cardiac rehab in [place] if she is approved for Medicaid.  Patient has stated that she will not be able to afford all of her medicines and she she has asked me what medications are the most important.  Patient was encouraged to stop smoking so she can afford her medications.  I gave the patient resources on how to quit.  Alejandra Hall [place], [place] 11/26/2013 2:57 PM

## 2013-11-26 NOTE — Discharge Instructions (Signed)
No driving for 3 days. No lifting over 5 lbs for 1 week. No sexual activity for 1 week. Keep procedure site clean & dry. If you notice increased pain, swelling, bleeding or pus, call/return! You may shower, but no soaking baths/hot tubs/pools for 1 week. ° °

## 2013-11-26 NOTE — Progress Notes (Signed)
Patient ID: Alejandra Hall, female   DOB: April 07, 1961, 53 y.o.   MRN: [id number]   SUBJECTIVE: No further chest pain.  Feels good.  Has been walking.   Scheduled Meds: . [START ON 11/27/2013] aspirin  81 mg Oral Daily  . atorvastatin  40 mg Oral q1800  . carvedilol  3.125 mg Oral BID WC  . clotrimazole  1 Applicatorful Vaginal QHS  . lisinopril  2.5 mg Oral Daily  . miconazole   Topical BID  . pantoprazole  40 mg Oral Daily  . prasugrel  10 mg Oral Daily   Continuous Infusions: . sodium chloride Stopped (11/24/13 2000)  . sodium chloride Stopped (11/25/13 2000)  . nitroGLYCERIN 10 mcg/min (11/24/13 0752)  . nitroGLYCERIN Stopped (11/25/13 0700)   PRN Meds:.acetaminophen, morphine injection, nitroGLYCERIN    Filed Vitals:   11/26/13 0400 11/26/13 0736 11/26/13 1157 11/26/13 1200  BP: 105/45 126/65 102/70 102/70  Pulse: 67 81 64   Temp: 98 F (36.7 C) 98.6 F (37 C) 98.2 F (36.8 C)   TempSrc: Oral Oral Oral   Resp:      Height:      Weight: 157 lb 6.5 oz (71.4 kg)     SpO2: 99% 99% 99%     Intake/Output Summary (Last 24 hours) at 11/26/13 1434 Last data filed at 11/26/13 1200  Gross per 24 hour  Intake 1146.4 ml  Output   1300 ml  Net -153.6 ml    LABS: Basic Metabolic Panel:  Recent Labs  62/95/28 0210 11/26/13 0304  NA 143 142  K 4.0 3.9  CL 108 106  CO2 23 24  GLUCOSE 107* 100*  BUN 11 12  CREATININE 0.76 0.85  CALCIUM 8.4 8.5   Liver Function Tests: No results found for this basename: AST, ALT, ALKPHOS, BILITOT, PROT, ALBUMIN,  in the last 72 hours No results found for this basename: LIPASE, AMYLASE,  in the last 72 hours CBC:  Recent Labs  11/25/13 0210 11/25/13 1912 11/26/13 0304  WBC 8.4  --  10.7*  HGB 11.3*  --  10.9*  HCT 33.3*  --  31.8*  MCV 88.6  --  88.3  PLT 244 265 236   Cardiac Enzymes:  Recent Labs  11/24/13 0818 11/24/13 1410 11/24/13 2120  CKTOTAL 70 187*  --   CKMB 6.3* 23.6*  --   TROPONINI 0.94* 3.36* 4.03*    BNP: No components found with this basename: POCBNP,  D-Dimer: No results found for this basename: DDIMER,  in the last 72 hours Hemoglobin A1C:  Recent Labs  11/24/13 1700  HGBA1C 5.3   Fasting Lipid Panel:  Recent Labs  11/25/13 0210  CHOL 151  148  HDL 31*  30*  LDLCALC 73  71  TRIG 234*  236*  CHOLHDL 4.9  4.9   Thyroid Function Tests: No results found for this basename: TSH, T4TOTAL, FREET3, T3FREE, THYROIDAB,  in the last 72 hours Anemia Panel: No results found for this basename: VITAMINB12, FOLATE, FERRITIN, TIBC, IRON, RETICCTPCT,  in the last 72 hours  RADIOLOGY: No results found.  PHYSICAL EXAM General: NAD Neck: No JVD, no thyromegaly or thyroid nodule.  Lungs: Clear to auscultation bilaterally with normal respiratory effort. CV: Nondisplaced PMI.  Heart regular S1/S2, no S3/S4, no murmur.  No peripheral edema.  No carotid bruit.  Normal pedal pulses.  Abdomen: Soft, nontender, no hepatosplenomegaly, no distention.  Neurologic: Alert and oriented x 3.  Psych: Normal affect. Extremities: No clubbing or cyanosis.  TELEMETRY: Reviewed telemetry pt in NSR  ASSESSMENT AND PLAN: 53 yo smoker [facility] with inferior STEMI.   1. CAD: Inferior STEMI.  She had thrombolysis at [facility] because unable to transfer (snow).  She eventually arrived here and had DES x 2 to RCA (culprit) and DES x 1 to LAD.  Doing well today. EF 40-45% on echo.  - Continue ASA 81, Effient. - Increase atorvastatin to 80 mg daily.  2. 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  Will have her on lisinopril 2.5 mg daily and Coreg 3.125 mg bid.  3. Disposition: I think that she can go home today.  Followup [place] office with Dr. Kirke CorinArida or Dr. Mariah MillingGollan in 2 wks.  She may have trouble getting her medications: make sure she gets card for 1 month Effient.  Meds for home: Atorvastatin 80 mg daily, lisinopril 2.5 daily, Coreg 3.125 mg bid, Effient 10 daily, ASA 81 daily.    Marca AnconaDalton Allyanna Hall 11/26/2013 2:39  PM

## 2013-11-26 NOTE — Discharge Summary (Signed)
CARDIOLOGY DISCHARGE SUMMARY   Patient ID: Alejandra Hall,  MRN: [id number], DOB/AGE: Jun 27, 1961 53 y.o.  Admit date: 11/24/2013 Discharge date: 11/26/2013  Primary Care Physician: None Primary Cardiologist: New to [facility]; to establish care in [facility] on discharge  Primary Discharge Diagnosis:  1. CAD s/p inferior STEMI 2. Ischemic CM, EF 40-45%  Secondary Discharge Diagnoses:  1. Dyslipidemia 2. DM 3. Crohn's disease 4. COPD  Procedures This Admission:  1. Cardiac catheterization 11/25/2013 HEMODYNAMICS: Aortic pressure was 105/63; LV pressure was 109/4; LVEDP 12. There was no gradient between the left ventricle and aorta.  ANGIOGRAPHIC DATA: The left main coronary artery is widely patent.  The left anterior descending artery is a large vessel which wraps around the apex. In the proximal vessel, there is an eccentric 60-70% stenosis. There is mild disease in the mid vessel. There is a large diagonal branch which has mild disease. The remainder of the mid to distal LAD past the diagonal is widely patent with only mild atherosclerosis.  The left circumflex artery is a large vessel proximally. The first obtuse marginal is small but widely patent. The second obtuse marginal is patent proximally. There is an aneurysmal segment just before bifurcation. The more anterior pole is widely patent. The more lateral pole is occluded proximally. This fills by left to left collaterals. The vessel appears small in caliber but is fairly long.  The right coronary artery is a large dominant vessel. There is moderate disease proximally. In the mid vessel, there is moderate to severe diffuse disease. There is a focal 70% stenosis, followed by a tortuous segment. After the tortuous segment, there is a focal 90% stenosis. The distal RCA appears widely patent. The posterior descending artery is large and widely patent. The posterior lateral artery is medium size and widely patent.  LEFT VENTRICULOGRAM:  Left ventricular angiogram was done in the 30 RAO projection and revealed normal left ventricular wall motion and systolic function with an estimated ejection fraction of 60 %. LVEDP was 12 mmHg.  PCI NARRATIVE: A CLS 3.0 guiding catheter was used to engage the left main. Additional IV heparin was given for anticoagulation. An ACT was used to check that the anticoagulation was therapeutic. A pressure wire was placed across the area disease in the proximal LAD. IV adenosine was given. The resting FFR was 0.94. The FFR after adenosine was 0.80. At that point, it was unclear whether the occluded OM branch was the culprit for her infarct. The pressure wire was removed and a pro-water wire was advanced to the circumflex. We attempted to cross the occlusion in the OM but this would not cross. 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 The pro-water wire was then directed down the LAD. The proximal LAD lesion which had been found significant by pressure wire was direct stented with a 3.5 x 12 from his drug-eluting stent, postdilated with a 3.75 x 8 noncompliant balloon. Intracoronary nitroglycerin was administered. There was an excellent angiographic result.  Attention was then turned to the RCA. Initially, a Williams right guiding catheter was used. The wire was placed down the vessel despite the fact that the [facility] right did not quite reach the ostium. Even with the wire across the lesion, the guide could not be pulled in. We switched to an Akari right 1.0 guide. A Fielder XT wire was placed across the area disease in the RCA. A 2.0 x 20 balloon was advanced but would not cross. A pro-water wire  was then placed across the area disease as a buddy wire. A 2.0 balloon did advance. This was used to predilate the RCA. We tried to advance a 2.5 x 38 promus drug-eluting stent, but this would not advance. A guideline or was then advanced with a 2.0 x 12 balloon in front of it. This was advanced to  the mid RCA. It went into position in the mid RCA. It was deployed at high pressure. The guideliner was removed. There is difficulty in getting a post dilatation balloon to advance to post-dilate the distal stent. Several balloons were tried. The proximal area to the stent also had moderate disease. This was subsequently stented with a 2.75 x 16 promise drug-eluting stent in overlapping fashion. This area was eventually postdilated with a 3.25 noncompliant balloon; however this balloon did not advance to the distal vessel. A grand slam wire was used as a buddy wire without success. Eventually, another pro-water wire was placed. A 3.0 x 8 noncompliant balloon was advanced to the distal stented area. After significant effort, the entire stented segment was post dilated. The postdilatation balloons were inflated to high pressure. There is an excellent angiographic result.  IMPRESSION:  1. Widely patent left main coronary artery. 2. Successful PCI of the proximal left anterior descending artery 3.5 x 12 Promus drug-eluting stent, postdilated to 3.8 mm in diameter 3. Patent left circumflex artery. 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 4. Successful, but complicated PCI of right coronary artery, requiring guideliner, with a 2.5 x 38 Promus drug-eluting stent, overlapping with a 2.75 x 16 promus drug-eluting stent more proximally. The entire stented segment was post dilated with a 3.0 noncompliant balloon distally and a 3.25 noncompliant balloon more proximally. 5. Normal left ventricular systolic function. LVEDP 12 mmHg. Ejection fraction 60%.  History and Hospital Course:  Alejandra Hall is a 53 year old woman with "borderline" DM, COPD and Crohn's disease who presented to [facility] with chest pain, nausea and diaphoresis. ECG showed SR with inferior ST elevation. Because of the weather she was unable to be transferred immediately to [facility] or [facility]. Because of the potential delay in  transfer she was treated with thrombolytic therapy / tPA. She was then transferred to [facility] with continued chest pain. On 11/25/2013 she underwent cardiac catheterization which revealed 3-vessel CAD - s/p DES to proxLAD, s/p DES x 2 to RCA and chronically occluded OM-2 with left to left collaterals. LVEF 40-45%. Please see details as outlined above. She tolerated this procedure well without immediate complication. She was monitored for 48 hours and remained hemodynamically stable and euvolemic. She is ambulating without difficulty. She has been seen, examined and deemed stable for discharge home today by Dr. Marca Ancona.       Discharge Vitals: Blood pressure 92/53, pulse 82, temperature 97.4 F (36.3 C), temperature source Oral, resp. rate 20, height 5\' 2"  (1.575 m), weight 157 lb 6.5 oz (71.4 kg), SpO2 94.00%.   Labs: Lab Results  Component Value Date   WBC 10.7* 11/26/2013   HGB 10.9* 11/26/2013   HCT 31.8* 11/26/2013   MCV 88.3 11/26/2013   PLT 236 11/26/2013    Recent Labs Lab 11/26/13 0304  NA 142  K 3.9  CL 106  CO2 24  BUN 12  CREATININE 0.85  CALCIUM 8.5  GLUCOSE 100*   Lab Results  Component Value Date   CKTOTAL 187* 11/24/2013   CKMB 23.6* 11/24/2013   TROPONINI 4.03* 11/24/2013  Lab Results  Component Value Date   CHOL 151 11/25/2013   CHOL 148 11/25/2013   Lab Results  Component Value Date   HDL 31* 11/25/2013   HDL 30* 11/25/2013   Lab Results  Component Value Date   LDLCALC 73 11/25/2013   LDLCALC 71 11/25/2013   Lab Results  Component Value Date   TRIG 234* 11/25/2013   TRIG 236* 11/25/2013   Lab Results  Component Value Date   CHOLHDL 4.9 11/25/2013   CHOLHDL 4.9 11/25/2013    Recent Labs  11/25/13 0210  INR 0.99    Disposition:  The patient is being discharged in stable condition.  Follow-up: Follow-up Information   Follow [facility] On 12/06/2013. (At 2:15 PM for hospital follow-up and establish care with Dr. Mariah MillingGollan)     Specialty:  Cardiology   Contact information:   [address], [address] [place] [address] [phone]     Discharge Medications:    Medication List         acetaminophen 500 MG tablet  Commonly known as:  TYLENOL  Take 500 mg by mouth every 6 (six) hours as needed for mild pain.     aspirin 81 MG tablet  Take 1 tablet (81 mg total) by mouth daily.     atorvastatin 80 MG tablet  Commonly known as:  LIPITOR  Take 1 tablet (80 mg total) by mouth daily at 6 PM.     carvedilol 3.125 MG tablet  Commonly known as:  COREG  Take 1 tablet (3.125 mg total) by mouth 2 (two) times daily with a meal.     lisinopril 2.5 MG tablet  Commonly known as:  PRINIVIL,ZESTRIL  Take 1 tablet (2.5 mg total) by mouth daily.     pantoprazole 40 MG tablet  Commonly known as:  PROTONIX  Take 1 tablet (40 mg total) by mouth daily.     prasugrel 10 MG Tabs tablet  Commonly known as:  EFFIENT  Take 1 tablet (10 mg total) by mouth daily.     Vitamin B Complex Tabs  Take 1 tablet by mouth daily.       Duration of Discharge Encounter: Greater than 30 minutes including physician time.  Signed, Rick DuffDMISTEN, Tyrel Lex, PA-C 11/26/2013, 5:19 PM

## 2013-12-06 ENCOUNTER — Ambulatory Visit (INDEPENDENT_AMBULATORY_CARE_PROVIDER_SITE_OTHER): Payer: Medicaid Other | Admitting: Cardiovascular Disease

## 2013-12-06 ENCOUNTER — Encounter: Payer: Self-pay | Admitting: Cardiovascular Disease

## 2013-12-06 VITALS — BP 100/64 | HR 71 | Ht 63.0 in | Wt 152.8 lb

## 2013-12-06 DIAGNOSIS — I219 Acute myocardial infarction, unspecified: Secondary | ICD-10-CM

## 2013-12-06 DIAGNOSIS — E119 Type 2 diabetes mellitus without complications: Secondary | ICD-10-CM

## 2013-12-06 DIAGNOSIS — I2510 Atherosclerotic heart disease of native coronary artery without angina pectoris: Secondary | ICD-10-CM

## 2013-12-06 DIAGNOSIS — E785 Hyperlipidemia, unspecified: Secondary | ICD-10-CM

## 2013-12-06 DIAGNOSIS — R0602 Shortness of breath: Secondary | ICD-10-CM

## 2013-12-06 DIAGNOSIS — R079 Chest pain, unspecified: Secondary | ICD-10-CM

## 2013-12-06 DIAGNOSIS — R7303 Prediabetes: Secondary | ICD-10-CM

## 2013-12-06 DIAGNOSIS — F17200 Nicotine dependence, unspecified, uncomplicated: Secondary | ICD-10-CM

## 2013-12-06 DIAGNOSIS — I213 ST elevation (STEMI) myocardial infarction of unspecified site: Secondary | ICD-10-CM

## 2013-12-06 MED ORDER — SIMVASTATIN 20 MG PO TABS: 20.0000 mg | ORAL_TABLET | Freq: Every day | ORAL | Status: DC

## 2013-12-06 MED ORDER — CLOPIDOGREL BISULFATE 75 MG PO TABS: 75.0000 mg | ORAL_TABLET | Freq: Every day | ORAL | Status: DC

## 2013-12-06 NOTE — Assessment & Plan Note (Signed)
We have encouraged her to continue to work on weaning her cigarettes and smoking cessation. She will continue to work on this. Suggested she restart Chantix

## 2013-12-06 NOTE — Assessment & Plan Note (Signed)
She is doing well following her recent stent placement to her LAD and RCA. Unable to participate in cardiac rehabilitation as she is unable to afford the co-pay. We have given her additional samples of effient to take with aspirin. When these run out, suggested she try switch to Plavix with aspirin

## 2013-12-06 NOTE — Assessment & Plan Note (Signed)
Cholesterol is intact in a reasonable range though given her underlying severe CAD we will start a statin. She is unable to afford generic Lipitor. We will start simvastatin 20 mg daily. Last cholesterol is 150

## 2013-12-06 NOTE — Patient Instructions (Addendum)
You are doing well. Please start simvastatin one a day for cholesterol  When you run out of effient, please start plavix one a day with aspiirn  Please call us if you have new issues that need to be addressed before your next appt.  Your physician wants you to follow-up in: 6 months.  You will receive a reminder letter in the mail two months in advance. If you don't receive a letter, please call our office to schedule the follow-up appointment.

## 2013-12-06 NOTE — Progress Notes (Signed)
Patient ID: Alejandra Hall, female    DOB: 1960/12/04, 53 y.o.   MRN: [id number]  HPI Comments: [name] is a pleasant 53 year old woman with a long history of smoking who continues to smoke, currently unemployed who is applying for Medicaid, history of anxiety, presents after recent non-ST elevation MI, transferred to [place] from [facility] with LAD and RCA stent placement.  She reports that in general symptoms started October 2014. She had stuttering pains with worsening pains at the end of February 2015 during a snowstorm. She was driving from [facility] to [place] when she developed chest pain, arm pain, jaw pain. She was initially seen at [facility] and given the cardiac cath lab was closed, she was given thrombolytics, transferred to [place] where she underwent cardiac catheterization. She had DES placed to her LAD and RCA.  Peak troponin the hospital was for 4.0 on 11/24/2013.  She was started on aspirin and effient samples.  Since that time she has felt better. She has been nervous about short leaking chest pain symptoms but nothing as she had before. She has not started cardiac rehabilitation. She like to wait several weeks as she is unable to afford the co-pay for rehabilitation.  EKG today shows normal sinus rhythm with rate 71 beats per minute, T wave abnormality in inferior leads   Outpatient Encounter Prescriptions as of 12/06/2013  Medication Sig  . aspirin 81 MG tablet Take 1 tablet (81 mg total) by mouth daily.  . carvedilol (COREG) 3.125 MG tablet Take 1 tablet (3.125 mg total) by mouth 2 (two) times daily with a meal.  . lisinopril (PRINIVIL,ZESTRIL) 2.5 MG tablet Take 1 tablet (2.5 mg total) by mouth daily.  . prasugrel (EFFIENT) 10 MG TABS tablet Take 1 tablet (10 mg total) by mouth daily.  . clopidogrel (PLAVIX) 75 MG tablet Take 1 tablet (75 mg total) by mouth daily.  . simvastatin (ZOCOR) 20 MG tablet Take 1 tablet (20 mg total) by mouth at bedtime.    Review of  Systems  Constitutional: Negative.   HENT: Negative.   Eyes: Negative.   Respiratory: Negative.   Cardiovascular: Negative.   Gastrointestinal: Negative.   Endocrine: Negative.   Musculoskeletal: Negative.   Skin: Negative.   Allergic/Immunologic: Negative.   Neurological: Negative.   Hematological: Negative.   Psychiatric/Behavioral: Negative.   All other systems reviewed and are negative.    BP 100/64  Pulse 71  Ht 5\' 3"  (1.6 m)  Wt 152 lb 12 oz (69.287 kg)  BMI 27.07 kg/m2  Physical Exam  Nursing note and vitals reviewed. Constitutional: She is oriented to person, place, and time. She appears well-developed and well-nourished.  HENT:  Head: Normocephalic.  Nose: Nose normal.  Mouth/Throat: Oropharynx is clear and moist.  Eyes: Conjunctivae are normal. Pupils are equal, round, and reactive to light.  Neck: Normal range of motion. Neck supple. No JVD present.  Cardiovascular: Normal rate, regular rhythm, S1 normal, S2 normal, normal heart sounds and intact distal pulses.  Exam reveals no gallop and no friction rub.   No murmur heard. Pulmonary/Chest: Effort normal and breath sounds normal. No respiratory distress. She has no wheezes. She has no rales. She exhibits no tenderness.  Abdominal: Soft. Bowel sounds are normal. She exhibits no distension. There is no tenderness.  Musculoskeletal: Normal range of motion. She exhibits no edema and no tenderness.  Lymphadenopathy:    She has no cervical adenopathy.  Neurological: She is alert and oriented to person, place, and time.  Coordination normal.  Skin: Skin is warm and dry. No rash noted. No erythema.  Psychiatric: She has a normal mood and affect. Her behavior is normal. Judgment and thought content normal.    Assessment and Plan

## 2013-12-06 NOTE — Assessment & Plan Note (Signed)
We have encouraged continued exercise, careful diet management in an effort to lose weight. 

## 2013-12-07 ENCOUNTER — Encounter: Payer: PRIVATE HEALTH INSURANCE | Admitting: Cardiovascular Disease

## 2013-12-12 ENCOUNTER — Telehealth: Payer: Self-pay | Admitting: *Deleted

## 2013-12-12 NOTE — Telephone Encounter (Signed)
lmtcb regarding medication that may be to expensive.

## 2013-12-12 NOTE — Telephone Encounter (Signed)
Can we find out which medicine was expensive for her We started simvastatin, Chantix, Also suggested changing to Plavix when she runs out of effient thx

## 2013-12-12 NOTE — Telephone Encounter (Signed)
Patient called and medicine in too expensive. What should she do?

## 2013-12-12 NOTE — Telephone Encounter (Signed)
There is no $4 alternative for plavix effient and brilinta is more expensive No other alternatives plavix is generic pricing with medication insurance Can we see if she has any medication insurance? Can we see if she qualifies for assistance with effient?  We can try pravastatin 20 mg daily ([facility] $4)

## 2013-12-12 NOTE — Telephone Encounter (Signed)
Please see below and advise.

## 2013-12-12 NOTE — Telephone Encounter (Signed)
Spoke with pt and she mentioned that Plavix runs around $100 and simvastatin runs about $65 and she can not afford neither one of them if they cost more than $4.00. She is currently using [facility]. Please advise.

## 2013-12-13 MED ORDER — PRAVASTATIN SODIUM 20 MG PO TABS
20.0000 mg | ORAL_TABLET | Freq: Every evening | ORAL | Status: DC
Start: 2013-12-13 — End: 2013-12-16

## 2013-12-13 NOTE — Telephone Encounter (Signed)
Spoke w/ pt and advised her of Dr. Windell Hummingbird recommendations. She would like samples of effient and a new rx for pravastatin sent in to her pharmacy. Pt to call w/ further questions or concerns.

## 2013-12-13 NOTE — Telephone Encounter (Signed)
Left message for pt to call back  °

## 2013-12-16 ENCOUNTER — Other Ambulatory Visit: Payer: Self-pay | Admitting: *Deleted

## 2013-12-16 ENCOUNTER — Telehealth: Payer: Self-pay | Admitting: *Deleted

## 2013-12-16 MED ORDER — PRAVASTATIN SODIUM 20 MG PO TABS: 20.0000 mg | ORAL_TABLET | Freq: Every evening | ORAL | Status: DC

## 2013-12-16 NOTE — Telephone Encounter (Signed)
Pt needed Rx sent for Pravastatin to [facility] on [address] For $4 medication.

## 2013-12-16 NOTE — Telephone Encounter (Signed)
Requested Prescriptions   Signed Prescriptions Disp Refills  . pravastatin (PRAVACHOL) 20 MG tablet 30 tablet 3    Sig: Take 1 tablet (20 mg total) by mouth every evening.    Authorizing Provider: Antonieta Iba    Ordering User: Kendrick Fries

## 2013-12-16 NOTE — Telephone Encounter (Signed)
Patient has a question on the cholestorol medicine please call patient.

## 2013-12-26 ENCOUNTER — Telehealth: Payer: Self-pay

## 2013-12-26 MED ORDER — LOVASTATIN 20 MG PO TABS: 20.0000 mg | ORAL_TABLET | Freq: Every day | ORAL | Status: DC

## 2013-12-26 NOTE — Telephone Encounter (Signed)
Spoke w/ Alejandra Hall.  She is appreciative of the change. She reports that she has had increasing chest pain when she lies down. Reports h/o "bad" heartburn, but she cannot afford any antacids at the moment, as she is waiting on disability papers. Asked Alejandra Hall to describe her average daily diet.  She reports that she is only receiving $68 a month in food stamps, so it is difficult for her to eat fresh foods. Reports that her diet typically involves ramen noodles, canned chicken and processed food.  Alejandra Hall states that she knows about elevated sodium levels in these foods, but simply cannot afford anything else right now.  She has tried a tsp of mustard w/ some relief of heartburn sx, but it is short lasting. Advised Alejandra Hall to sleep w/ head of bed elevated and speak w/ her PCP.  She reports that she is currently looking for a new PCP, as "Dr. Lacie Scotts is an independent doctor and can't fill out my disability papers." Alejandra Hall to call w/ further questions or concerns.

## 2013-12-26 NOTE — Telephone Encounter (Signed)
Ok to change to lovastatin 20 mg daily

## 2013-12-26 NOTE — Telephone Encounter (Signed)
Pt states that the cholesterol medication that was called in was the wrong one. Pt states that it was $14, and she needs the $4 one. Please call.

## 2014-01-12 ENCOUNTER — Telehealth: Payer: Self-pay | Admitting: *Deleted

## 2014-01-12 NOTE — Telephone Encounter (Signed)
Spoke w/ pt.  She is not currently taking her lovastatin. Reports her that her "esophagus swelled up all the way to her intestines" and she "broke out in a rash on her head, face, arms and hands". Reports that symptoms resolved after stopping the med.  She would like a new med, but is on a limited income and asks that she stay on the $4 list.  Advised pt that lovastatin is the only statin on the $4 list. She would appreciate "something cheap".

## 2014-01-12 NOTE — Telephone Encounter (Signed)
Patient called and is unable to take Lovastatin 20mg .  Made her throat swell. Please call her.

## 2014-01-12 NOTE — Telephone Encounter (Signed)
Try simvastatin 20 mg daily Cheap at [employer]

## 2014-01-13 MED ORDER — SIMVASTATIN 20 MG PO TABS: 20.0000 mg | ORAL_TABLET | Freq: Every day | ORAL | Status: DC

## 2014-01-13 NOTE — Telephone Encounter (Signed)
Spoke w/ pt.  Advised her of Dr. Windell Hummingbird recommendation. She is agreeable to trying simvastatin.   She would like it sent to [facility] and she will call to let us know how she tolerates this.

## 2014-01-13 NOTE — Telephone Encounter (Signed)
Left message for pt to call back  °

## 2014-01-18 ENCOUNTER — Telehealth: Payer: Self-pay

## 2014-01-18 NOTE — Telephone Encounter (Signed)
No idea what to call in. Would try urgent care or PMD.  I don't know those kind of ABX sorry

## 2014-01-18 NOTE — Telephone Encounter (Signed)
Patient has an abscess on her tooth and needs an antibiotic called in. The patient does not have a dentist and states, " I would rather Dr. Mariah Milling call something in because he is aware of her medications and her history."

## 2014-01-18 NOTE — Telephone Encounter (Signed)
Spoke w/ pt.  Advised her of Dr. Windell Hummingbird recommendation and to seek treatment as soon as possible.  She is agreeable to this and will call if we can be of further assistance.

## 2014-02-16 ENCOUNTER — Telehealth: Payer: Self-pay | Admitting: *Deleted

## 2014-02-16 NOTE — Telephone Encounter (Signed)
Patient called wanting samples Effient 10mg . Please call when ready

## 2014-02-16 NOTE — Telephone Encounter (Signed)
Placed sampled of Effient 10 mg at front desk for pick up.

## 2014-03-15 ENCOUNTER — Telehealth: Payer: Self-pay

## 2014-03-15 NOTE — Telephone Encounter (Signed)
Pt called and states she has medicaid now, and wants to go to cardiac rehab. Please call.

## 2014-03-15 NOTE — Telephone Encounter (Signed)
Spoke w/ pt.  Advised her that I am faxing orders to [facility] for cardiac rehab and they will contact her w/ an appt.  Pt to call w/ further questions or concerns.

## 2014-03-28 ENCOUNTER — Encounter: Payer: Self-pay | Admitting: Cardiovascular Disease

## 2014-03-29 ENCOUNTER — Encounter: Payer: Self-pay | Admitting: Cardiovascular Disease

## 2014-04-19 ENCOUNTER — Ambulatory Visit: Payer: Medicaid Other | Admitting: *Deleted

## 2014-04-19 VITALS — BP 110/78 | HR 74 | Wt 155.4 lb

## 2014-04-19 DIAGNOSIS — R079 Chest pain, unspecified: Secondary | ICD-10-CM

## 2014-04-19 NOTE — Patient Instructions (Addendum)
Your EKG is normal   Dr. Mariah Milling will review your symptoms and we will call you with his recommendation  If you have chest pain that does not resolve with position change or relaxation please contact EMS  If you feel you are experiencing stroke like symptoms (ex: weakness, facial droop, confusion) contact EMS   We will review your cardiac clearance form with Dr. Mariah Milling and call you when it is complete   Establish primary care as soon as possible to discuss your headaches and left sided weakness    Follow up with Dr. Mariah Milling Friday 04/28/14 at 1:45 pm

## 2014-04-19 NOTE — Progress Notes (Signed)
1.) Reason for visit: Chest Pain   2.) Name of MD requesting visit: Dr. Mariah Milling   3.) H&P: NSTEMI in Feb 2015  ROS related to problem: Patient walked in the office today with complaints of chest pain after cardiac rehab today she stated her pain was a 3 out of 10. Her pain had resolved by the time she came into the office. She stated the pain was felt in her mid and upper left chest above her left breast. She stated the pain was also felt in her left arm and jaw. She said she felt "a little dizzy at the time of the pain and kind of like a zombie. This has been going on since my heart attack in February". She stated she is also having some weakness on occasion on her left hand side and the left side of her droops at times. She is not currently feeling the left sided weakness.  She also needed [place] to fill out a cardiac clearance form to have all of her teeth removed. Her EKG showed normal sinus rhythm. EKG reviewed by Dr. Mariah Milling during the visit. Patient scheduled for follow up with Dr. Mariah Milling on 04/28/14. Patient advised to go to the ED if she experiences chest pain or stroke like symptoms.  She was also advised to establish primary as soon as possible. Patient verbalized understanding.   4) Assessment and plan per MD: Per Dr. Mariah Milling advised patient to establish primary care. Follow up with Dr. Mariah Milling 04/28/14. Contact EMS if situation becomes emergent.

## 2014-04-27 ENCOUNTER — Ambulatory Visit: Payer: Medicaid Other | Admitting: Cardiovascular Disease

## 2014-04-28 ENCOUNTER — Encounter: Payer: Self-pay | Admitting: Cardiovascular Disease

## 2014-04-28 ENCOUNTER — Ambulatory Visit (INDEPENDENT_AMBULATORY_CARE_PROVIDER_SITE_OTHER): Payer: Medicaid Other | Admitting: Cardiovascular Disease

## 2014-04-28 VITALS — BP 119/87 | Ht 64.0 in | Wt 154.5 lb

## 2014-04-28 DIAGNOSIS — R7309 Other abnormal glucose: Secondary | ICD-10-CM

## 2014-04-28 DIAGNOSIS — I200 Unstable angina: Secondary | ICD-10-CM | POA: Insufficient documentation

## 2014-04-28 DIAGNOSIS — R42 Dizziness and giddiness: Secondary | ICD-10-CM

## 2014-04-28 DIAGNOSIS — I209 Angina pectoris, unspecified: Secondary | ICD-10-CM

## 2014-04-28 DIAGNOSIS — I213 ST elevation (STEMI) myocardial infarction of unspecified site: Secondary | ICD-10-CM

## 2014-04-28 DIAGNOSIS — I219 Acute myocardial infarction, unspecified: Secondary | ICD-10-CM

## 2014-04-28 DIAGNOSIS — R079 Chest pain, unspecified: Secondary | ICD-10-CM

## 2014-04-28 DIAGNOSIS — F17200 Nicotine dependence, unspecified, uncomplicated: Secondary | ICD-10-CM

## 2014-04-28 DIAGNOSIS — R7303 Prediabetes: Secondary | ICD-10-CM

## 2014-04-28 DIAGNOSIS — E785 Hyperlipidemia, unspecified: Secondary | ICD-10-CM

## 2014-04-28 MED ORDER — NITROGLYCERIN 0.4 MG SL SUBL: 0.4000 mg | SUBLINGUAL_TABLET | SUBLINGUAL | Status: DC | PRN

## 2014-04-28 NOTE — Assessment & Plan Note (Signed)
She reports continued chest pain with heavy exertion. Symptoms started even after stent placement. She has completed cardiac rehabilitation. No further testing at this time. If symptoms get worse, we have suggested she call the office. Less likely in-stent restenosis of her stents given the chronicity of her symptoms.

## 2014-04-28 NOTE — Patient Instructions (Signed)
You are doing well. No medication changes were made.  Please call us if you have new issues that need to be addressed before your next appt.  Your physician wants you to follow-up in: 6 months.  You will receive a reminder letter in the mail two months in advance. If you don't receive a letter, please call our office to schedule the follow-up appointment.   

## 2014-04-28 NOTE — Assessment & Plan Note (Signed)
Prior stent to her RCA and LAD. Continue on aspirin and effient. Cost not an issue as it costs $3

## 2014-04-28 NOTE — Assessment & Plan Note (Signed)
We have encouraged her to continue to work on weaning her cigarettes and smoking cessation. She will continue to work on this and does not want any assistance with chantix.  

## 2014-04-28 NOTE — Assessment & Plan Note (Signed)
We have encouraged continued exercise, careful diet management in an effort to lose weight. 

## 2014-04-28 NOTE — Progress Notes (Signed)
Patient ID: Alejandra Hall, female    DOB: Aug 28, 1961, 53 y.o.   MRN: [id number]  HPI Comments: Alejandra Hall is a pleasant 53 year old woman with a long history of smoking who continues to smoke, currently unemployed who is applying for Medicaid with the help of a lawyer, history of anxiety, history of non-ST elevation MI, transferred to [place] from [facility] with LAD and RCA stent placement. She presents for routine followup  Prior chest pain symptoms started in October 2014. She had stuttering pains with worsening pains at the end of February 2015 during a snowstorm. She was driving from [facility] to [place] when she developed chest pain, arm pain, jaw pain. she was given thrombolytics, transferred to [place] where she underwent cardiac catheterization. She had DES placed to her LAD and RCA.  Peak troponin the hospital was for 4.0 on 11/24/2013.    In followup today she reports that she is doing well. She did cardiac rehabilitation and finishes this week. If she pushes herself hard, she does have some neck pain, upper chest pain. Symptoms resolved with rest. This has been a chronic issue since the stenting.  Significant stress at home trying to pay her bills. She's not working. Husband is working less and only getting by. Most of her medications cost $3  EKG today shows normal sinus rhythm with rate 86 beats per minute, no significant ST or T wave changes   Outpatient Encounter Prescriptions as of 04/28/2014  Medication Sig  . aspirin 81 MG tablet Take 1 tablet (81 mg total) by mouth daily.  . carvedilol (COREG) 3.125 MG tablet Take 1 tablet (3.125 mg total) by mouth 2 (two) times daily with a meal.  . lisinopril (PRINIVIL,ZESTRIL) 2.5 MG tablet Take 1 tablet (2.5 mg total) by mouth daily.  . prasugrel (EFFIENT) 10 MG TABS tablet Take 1 tablet (10 mg total) by mouth daily.  . simvastatin (ZOCOR) 20 MG tablet Take 1 tablet (20 mg total) by mouth at bedtime.  . [DISCONTINUED]  clopidogrel (PLAVIX) 75 MG tablet Take 1 tablet (75 mg total) by mouth daily.  . [DISCONTINUED] lovastatin (MEVACOR) 20 MG tablet Take 1 tablet (20 mg total) by mouth at bedtime.    Review of Systems  Constitutional: Negative.   HENT: Negative.   Eyes: Negative.   Respiratory: Positive for chest tightness.   Cardiovascular: Negative.   Gastrointestinal: Negative.   Endocrine: Negative.   Musculoskeletal: Negative.   Skin: Negative.   Allergic/Immunologic: Negative.   Neurological: Negative.   Hematological: Negative.   Psychiatric/Behavioral: Negative.   All other systems reviewed and are negative.   BP 119/87  Ht 5\' 4"  (1.626 m)  Wt 154 lb 8 oz (70.081 kg)  BMI 26.51 kg/m2  Physical Exam  Nursing note and vitals reviewed. Constitutional: She is oriented to person, place, and time. She appears well-developed and well-nourished.  HENT:  Head: Normocephalic.  Nose: Nose normal.  Mouth/Throat: Oropharynx is clear and moist.  Eyes: Conjunctivae are normal. Pupils are equal, round, and reactive to light.  Neck: Normal range of motion. Neck supple. No JVD present.  Cardiovascular: Normal rate, regular rhythm, S1 normal, S2 normal, normal heart sounds and intact distal pulses.  Exam reveals no gallop and no friction rub.   No murmur heard. Pulmonary/Chest: Effort normal and breath sounds normal. No respiratory distress. She has no wheezes. She has no rales. She exhibits no tenderness.  Abdominal: Soft. Bowel sounds are normal. She exhibits no distension. There is no tenderness.  Musculoskeletal: Normal range of motion. She exhibits no edema and no tenderness.  Lymphadenopathy:    She has no cervical adenopathy.  Neurological: She is alert and oriented to person, place, and time. Coordination normal.  Skin: Skin is warm and dry. No rash noted. No erythema.  Psychiatric: She has a normal mood and affect. Her behavior is normal. Judgment and thought content normal.    Assessment  and Plan

## 2014-04-28 NOTE — Assessment & Plan Note (Signed)
Cholesterol is at goal on the current lipid regimen. No changes to the medications were made.  

## 2014-04-29 ENCOUNTER — Encounter: Payer: Self-pay | Admitting: Cardiovascular Disease

## 2014-05-02 ENCOUNTER — Telehealth: Payer: Self-pay

## 2014-05-02 NOTE — Telephone Encounter (Signed)
Pt to have dental office refax this paperwork.

## 2014-05-02 NOTE — Telephone Encounter (Signed)
Pt called asking about a paper she dropped off her so she can have her teeth pulled. Please call.

## 2014-06-13 ENCOUNTER — Telehealth: Payer: Self-pay | Admitting: *Deleted

## 2014-06-13 NOTE — Telephone Encounter (Signed)
Spoke w/ pt.  She states that she has a knot on her arm that is getting bigger, w/ another smaller knot coming up next to it.  She states that someone in this office previously examined this and she would need to see a neurologist, but I see no documentation of this.  Pt denies injury.  States that she saw her PCP, but forgot to mention this to him.  She asks that we contact her PCP and ask for a referral to neurology.  Advised pt that she will need to contact her PCP as Medicaid will require their office to make any referrals.  She verbalizes understanding and will call back if we can be of further assistance.

## 2014-06-13 NOTE — Telephone Encounter (Signed)
Patient has a knot coming coming up on her arm and is concerned.

## 2014-07-21 ENCOUNTER — Emergency Department: Payer: Self-pay | Admitting: Emergency Medicine

## 2014-07-21 LAB — COMPREHENSIVE METABOLIC PANEL
ALK PHOS: 152 U/L — AB
ALT: 34 U/L
Albumin: 3.9 g/dL (ref 3.4–5.0)
BILIRUBIN TOTAL: 0.4 mg/dL (ref 0.2–1.0)
BUN: 18 mg/dL (ref 7–18)
CALCIUM: 9.5 mg/dL (ref 8.5–10.1)
CHLORIDE: 103 mmol/L (ref 98–107)
Co2: 27 mmol/L (ref 21–32)
Creatinine: 0.88 mg/dL (ref 0.60–1.30)
EGFR (African American): >60
EGFR (Non-African Amer.): >60
GLUCOSE: 120 mg/dL — AB (ref 65–99)
Potassium: 4.2 mmol/L (ref 3.5–5.1)
SGOT(AST): 23 U/L (ref 15–37)
Sodium: 136 mmol/L (ref 136–145)
Total Protein: 8.1 g/dL (ref 6.4–8.2)

## 2014-07-21 LAB — URINALYSIS, COMPLETE
BILIRUBIN, UR: NEGATIVE
BLOOD: NEGATIVE
Glucose,UR: NEGATIVE mg/dL (ref 0–75)
Nitrite: NEGATIVE
PH: 5 (ref 4.5–8.0)
Protein: NEGATIVE
RBC,UR: 4 /HPF (ref 0–5)
Specific Gravity: 1.025 (ref 1.003–1.030)
Squamous Epithelial: 1
WBC UR: 1 /HPF (ref 0–5)

## 2014-07-21 LAB — CBC WITH DIFFERENTIAL/PLATELET
Basophil #: 0 10*3/uL (ref 0.0–0.1)
Basophil %: 0.2 %
EOS ABS: 0.1 10*3/uL (ref 0.0–0.7)
Eosinophil %: 0.3 %
HCT: 41.1 % (ref 35.0–47.0)
HGB: 13.2 g/dL (ref 12.0–16.0)
Lymphocyte #: 1.7 10*3/uL (ref 1.0–3.6)
Lymphocyte %: 8.4 %
MCH: 28.7 pg (ref 26.0–34.0)
MCHC: 32.1 g/dL (ref 32.0–36.0)
MCV: 89 fL (ref 80–100)
MONO ABS: 1.2 x10 3/mm — AB (ref 0.2–0.9)
Monocyte %: 5.8 %
NEUTROS PCT: 85.3 %
Neutrophil #: 17.5 10*3/uL — ABNORMAL HIGH (ref 1.4–6.5)
PLATELETS: 386 10*3/uL (ref 150–440)
RBC: 4.61 10*6/uL (ref 3.80–5.20)
RDW: 15.6 % — ABNORMAL HIGH (ref 11.5–14.5)
WBC: 20.5 10*3/uL — ABNORMAL HIGH (ref 3.6–11.0)

## 2014-08-08 ENCOUNTER — Telehealth: Payer: Self-pay

## 2014-08-08 NOTE — Telephone Encounter (Signed)
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 Please call and advise.

## 2014-08-08 NOTE — Telephone Encounter (Signed)
Ok to drive a truck  Would call for any symptoms concerning for worsening angina

## 2014-08-09 NOTE — Telephone Encounter (Signed)
Spoke w/ pt.  Advised her of Dr. Windell Hummingbird recommendation.  She asks that I mail this to her.  Asked her to call back w/ any questions or concerns.

## 2014-09-07 ENCOUNTER — Encounter (HOSPITAL_COMMUNITY): Payer: Self-pay | Admitting: Interventional Cardiology

## 2014-10-02 ENCOUNTER — Other Ambulatory Visit: Payer: Self-pay

## 2014-10-02 ENCOUNTER — Other Ambulatory Visit: Payer: Self-pay | Admitting: *Deleted

## 2014-10-02 MED ORDER — PRASUGREL HCL 10 MG PO TABS: 10.0000 mg | ORAL_TABLET | Freq: Every day | ORAL | Status: DC

## 2014-10-02 NOTE — Telephone Encounter (Signed)
rx was sent to wrong pharmacy, should be [facility]

## 2014-10-03 MED ORDER — PRASUGREL HCL 10 MG PO TABS: 10.0000 mg | ORAL_TABLET | Freq: Every day | ORAL | Status: DC

## 2014-10-04 ENCOUNTER — Other Ambulatory Visit: Payer: Self-pay | Admitting: Cardiology

## 2014-10-25 ENCOUNTER — Ambulatory Visit: Payer: Medicaid Other | Admitting: Cardiovascular Disease

## 2014-11-29 ENCOUNTER — Ambulatory Visit: Payer: Medicaid Other | Admitting: Cardiovascular Disease

## 2014-12-15 ENCOUNTER — Telehealth: Payer: Self-pay | Admitting: Cardiology

## 2014-12-15 NOTE — Telephone Encounter (Signed)
Pt called and wanted something for pain- she has an abscessed tooth. I suggested she call her PCP for pain medication. I also asked her to call Dr Windell Hummingbird office Monday about stopping her Plavix if needed for tooth extraction.  Corine Shelter PA-C 12/15/2014 6:24 PM

## 2014-12-28 ENCOUNTER — Telehealth: Payer: Self-pay | Admitting: *Deleted

## 2014-12-28 NOTE — Telephone Encounter (Signed)
Spoke w/ pt.  Advised her of Ryan's recommendation.  She reports that she stopped her Effient on 12/16/14. When asked why she did this, she states "so I can get my teeth pulled". She then asks if I know a good dentist that she can see for this. Advised pt to resume Effient until she is scheduled to actually have her teeth pulled and then hold 5 days prior.  She verbalizes understanding and will call back w/ any questions or concerns.

## 2014-12-28 NOTE — Telephone Encounter (Signed)
Patient is 13 months from her NSTEMI in 10/2013. She would be able to stop her Effient 5 days prior to procedure. Given her comorbidities would recommend continuation of aspirin 81 mg daily if possible.

## 2014-12-28 NOTE — Telephone Encounter (Signed)
Request for surgical clearance:  1. What type of surgery is being performed? All teeth pulled   2. When is this surgery scheduled? Hasn't been scheduled  3. Are there any medications that need to be held prior to surgery and how long? Wants to know how long she needs to be off her blood thinner before she schedules her surgery  4. Name of physician performing surgery? Patient doesn't know (not at home).  5. What is your office phone and fax number? Patient doesn't know 6.

## 2014-12-29 NOTE — Telephone Encounter (Signed)
Patient is calling for a clearance form to send to Dentist in [place].  Mandi printed form.  Faxed to Dental office.  Patient is aware and will call to schedule appt. With Dentist.

## 2014-12-31 ENCOUNTER — Other Ambulatory Visit: Payer: Self-pay | Admitting: Cardiovascular Disease

## 2015-01-01 ENCOUNTER — Other Ambulatory Visit: Payer: Self-pay | Admitting: *Deleted

## 2015-01-01 MED ORDER — CARVEDILOL 3.125 MG PO TABS: 3.1250 mg | ORAL_TABLET | Freq: Two times a day (BID) | ORAL | Status: DC

## 2015-01-01 MED ORDER — SIMVASTATIN 20 MG PO TABS: 20.0000 mg | ORAL_TABLET | Freq: Every day | ORAL | Status: DC

## 2015-01-02 ENCOUNTER — Other Ambulatory Visit: Payer: Self-pay | Admitting: Cardiovascular Disease

## 2015-01-12 ENCOUNTER — Ambulatory Visit: Payer: Medicaid Other | Admitting: Cardiovascular Disease

## 2015-01-25 ENCOUNTER — Telehealth: Payer: Self-pay | Admitting: Cardiovascular Disease

## 2015-01-25 NOTE — Telephone Encounter (Signed)
Spoke w/ pt.  Advised her to contact performing MDs office and have them fax [place] cardiac clearance. She verbalizes understanding and will wait to hear back w/ instructions after we receive this.

## 2015-01-25 NOTE — Telephone Encounter (Signed)
Patient needs to know how long to hold blood thinners for colonoscopy.  Please call patient.

## 2015-02-06 ENCOUNTER — Encounter: Payer: Self-pay | Admitting: Cardiovascular Disease

## 2015-02-06 ENCOUNTER — Ambulatory Visit (INDEPENDENT_AMBULATORY_CARE_PROVIDER_SITE_OTHER): Payer: Medicaid Other | Admitting: Cardiovascular Disease

## 2015-02-06 VITALS — BP 100/78 | HR 76 | Ht 62.0 in | Wt 154.5 lb

## 2015-02-06 DIAGNOSIS — E785 Hyperlipidemia, unspecified: Secondary | ICD-10-CM | POA: Diagnosis not present

## 2015-02-06 DIAGNOSIS — K5090 Crohn's disease, unspecified, without complications: Secondary | ICD-10-CM

## 2015-02-06 DIAGNOSIS — I2510 Atherosclerotic heart disease of native coronary artery without angina pectoris: Secondary | ICD-10-CM | POA: Diagnosis not present

## 2015-02-06 DIAGNOSIS — R7309 Other abnormal glucose: Secondary | ICD-10-CM

## 2015-02-06 DIAGNOSIS — R079 Chest pain, unspecified: Secondary | ICD-10-CM | POA: Diagnosis not present

## 2015-02-06 DIAGNOSIS — I25118 Atherosclerotic heart disease of native coronary artery with other forms of angina pectoris: Secondary | ICD-10-CM | POA: Insufficient documentation

## 2015-02-06 DIAGNOSIS — Z720 Tobacco use: Secondary | ICD-10-CM

## 2015-02-06 DIAGNOSIS — R7303 Prediabetes: Secondary | ICD-10-CM

## 2015-02-06 DIAGNOSIS — F17200 Nicotine dependence, unspecified, uncomplicated: Secondary | ICD-10-CM

## 2015-02-06 MED ORDER — SIMVASTATIN 40 MG PO TABS: 40.0000 mg | ORAL_TABLET | Freq: Every day | ORAL | Status: DC

## 2015-02-06 NOTE — Assessment & Plan Note (Signed)
We have encouraged continued exercise, careful diet management in an effort to lose weight. 

## 2015-02-06 NOTE — Patient Instructions (Addendum)
You are doing well.  Please increase the simvastatin up to 40 mg daily Cholesterol is too high Watch the bread, potato, pasta   If you get very dizzy, Decrease the coreg down to one a day Drink more fluids  Please call us if you have new issues that need to be addressed before your next appt.  Your physician wants you to follow-up in: 6 months.  You will receive a reminder letter in the mail two months in advance. If you don't receive a letter, please call our office to schedule the follow-up appointment.

## 2015-02-06 NOTE — Progress Notes (Signed)
Patient ID: Alejandra Hall, female    DOB: 07-25-1961, 54 y.o.   MRN: [id number]  HPI Comments: Alejandra Hall is a pleasant 54 year old woman with a long history of smoking who continues to smoke, currently unemployed, on Medicaid, history of anxiety, history of non-ST elevation MI, transferred to [place] from [facility] with LAD and RCA stent placement. She presents for routine followup of her coronary artery disease  In follow-up, she reports that she has rare episodes of chest pain relieved with albuterol. She reports having arthritis in the mornings. Recently had many of her teeth pulled, stopped her effient for several weeks. She reports that she needs a colonoscopy for possible Crohn's disease. She  Has pending workup through [facility]. Occasionally has dizziness which she presumes is from low blood pressure. . She does not use much salt   she feels she is gaining weight, eating lots of pasta.  Lab work reviewed with her showing total cholesterol  172, LDL 99 in September 2015. Elevated white blood cell count of 21 in October 2015 that returned down to normal December 2015. CRP of 2  EKG on today's visit shows normal sinus rhythm with rate 76 bpm, no significant ST or T-wave changes  Other past medical history Prior chest pain symptoms started in October 2014. She had stuttering pains with worsening pains at the end of February 2015 during a snowstorm. She was driving from [facility] to [place] when she developed chest pain, arm pain, jaw pain. she was given thrombolytics, transferred to [place] where she underwent cardiac catheterization. She had DES placed to her LAD and RCA.  Peak troponin the hospital was for 4.0 on 11/24/2013.    Most of her medications cost $3   No Known Allergies  Current Outpatient Prescriptions on File Prior to Visit  Medication Sig Dispense Refill  . aspirin 81 MG tablet Take 1 tablet (81 mg total) by mouth daily.    . carvedilol (COREG) 3.125 MG tablet  Take 1 tablet (3.125 mg total) by mouth 2 (two) times daily with a meal. 60 tablet 3  . EFFIENT 10 MG TABS tablet TAKE 1 TABLET BY MOUTH EVERY DAY 30 tablet 3  . nitroGLYCERIN (NITROSTAT) 0.4 MG SL tablet Place 1 tablet (0.4 mg total) under the tongue every 5 (five) minutes as needed for chest pain. 25 tablet 3   No current facility-administered medications on file prior to visit.    Past Medical History  Diagnosis Date  . Borderline type 2 diabetes mellitus 11/24/2013  . Crohn's disease 11/24/2013  . Hypertension   . CAD (coronary artery disease)     s/p inferior STEMI; s/p PCI to LAD and RCA  . MI (myocardial infarction) 2015    Past Surgical History  Procedure Laterality Date  . Partial hysterectomy    . Appendectomy    . Cesarean section      X 2  . Cardiac catheterization      s/p inferior STEMI; s/p PCI to LAD and RCA  . Coronary angioplasty      s/p inferior STEMI; s/p PCI to LAD and RCA  . Left heart catheterization with coronary angiogram N/A 11/25/2013    Procedure: LEFT HEART CATHETERIZATION WITH CORONARY ANGIOGRAM;  Surgeon: Corky Crafts, MD;  Location: [facility] CATH LAB;  Service: Cardiovascular;  Laterality: N/A;  . Fractional flow reserve wire Right 11/25/2013    Procedure: FRACTIONAL FLOW RESERVE WIRE;  Surgeon: Corky Crafts, MD;  Location: [facility] CATH LAB;  Service: Cardiovascular;  Laterality: Right;  . Percutaneous coronary stent intervention (pci-s)  11/25/2013    Procedure: PERCUTANEOUS CORONARY STENT INTERVENTION (PCI-S);  Surgeon: Corky Crafts, MD;  Location: [facility] CATH LAB;  Service: Cardiovascular;;    Social History  reports that she has been smoking Cigarettes.  She has been smoking about 0.50 packs per day. She has never used smokeless tobacco. She reports that she does not drink alcohol or use illicit drugs.  Family History family history includes Diabetes in her mother; Hypertension in her mother; Stroke in her maternal grandfather.   Review  of Systems  Constitutional: Negative.   Respiratory: Positive for chest tightness.   Cardiovascular: Negative.   Gastrointestinal: Negative.   Musculoskeletal: Negative.   Skin: Negative.   Neurological: Negative.   Hematological: Negative.   Psychiatric/Behavioral: Negative.   All other systems reviewed and are negative.   BP 100/78 mmHg  Pulse 76  Ht 5\' 2"  (1.575 m)  Wt 154 lb 8 oz (70.081 kg)  BMI 28.25 kg/m2  Physical Exam  Constitutional: She is oriented to person, place, and time. She appears well-developed and well-nourished.  HENT:  Head: Normocephalic.  Nose: Nose normal.  Mouth/Throat: Oropharynx is clear and moist.  Eyes: Conjunctivae are normal. Pupils are equal, round, and reactive to light.  Neck: Normal range of motion. Neck supple. No JVD present.  Cardiovascular: Normal rate, regular rhythm, S1 normal, S2 normal, normal heart sounds and intact distal pulses.  Exam reveals no gallop and no friction rub.   No murmur heard. Pulmonary/Chest: Effort normal and breath sounds normal. No respiratory distress. She has no wheezes. She has no rales. She exhibits no tenderness.  Abdominal: Soft. Bowel sounds are normal. She exhibits no distension. There is no tenderness.  Musculoskeletal: Normal range of motion. She exhibits no edema or tenderness.  Lymphadenopathy:    She has no cervical adenopathy.  Neurological: She is alert and oriented to person, place, and time. Coordination normal.  Skin: Skin is warm and dry. No rash noted. No erythema.  Psychiatric: She has a normal mood and affect. Her behavior is normal. Judgment and thought content normal.    Assessment and Plan  Nursing note and vitals reviewed.

## 2015-02-06 NOTE — Assessment & Plan Note (Signed)
Currently with no symptoms of angina. No further workup at this time. Continue current medication regimen. Some atypical pain relieved with albuterol

## 2015-02-06 NOTE — Assessment & Plan Note (Signed)
We have encouraged her to continue to work on weaning her cigarettes and smoking cessation. She will continue to work on this and does not want any assistance with chantix.  

## 2015-02-06 NOTE — Assessment & Plan Note (Signed)
She reports that she has follow-up with [facility] for colonoscopy. Recommended that she stop her Effient 5 days prior to the procedure

## 2015-02-06 NOTE — Assessment & Plan Note (Signed)
We will increase her simvastatin up to 40 mg daily. Goal LDL less than 70 Encouraged her to decrease her  Pasta and high carbohydrate foods

## 2015-02-19 ENCOUNTER — Telehealth: Payer: Self-pay

## 2015-02-19 NOTE — Telephone Encounter (Signed)
Pt called, states her cholesterol pill, Simvastatin may be causing some side effectes,  states she is "sore around her heart, and my right leg is sore, and feels like it is swollen, but is not".  Please call.

## 2015-02-19 NOTE — Telephone Encounter (Signed)
Spoke w/ pt.  She reports soreness on the left side of her chest and leg cramping since increasing her simvastatin from 20 mg to 40 mg.  She would like to go back to her previous dose of 20 mg and see if her sx resolve.  Asked her to call me back next week and let me know how she is doing.

## 2015-03-06 ENCOUNTER — Telehealth: Payer: Self-pay | Admitting: Cardiovascular Disease

## 2015-03-06 NOTE — Telephone Encounter (Signed)
Patient said lawyer requested a letter for application for disability on 02-12-15 and havent heard anything yet.  Please call patient.

## 2015-03-06 NOTE — Telephone Encounter (Signed)
I spoke with the patient. I advised her I did find the letter from her attorney's office in Dr. Windell Hummingbird in basket.  I explained I would pull to the top of his paperwork for him to hopefully complete a letter for her next week when he returns.  I advised I would leave a message for Dr. Windell Hummingbird nurse, [facility], to please have him do the letter and call her when completed. She is appreciative for the call back.

## 2015-03-09 NOTE — Telephone Encounter (Signed)
Dr. Windell Hummingbird last office note faxed to [employer], attn [name] @ [phone].

## 2015-03-09 NOTE — Telephone Encounter (Signed)
Would send last office note to the lawyer and see if they still want letter from our office she is stable with no active cardiac issues, for example, no unstable angina. Do not think a letter from our office would add much weight for her disability application

## 2015-03-09 NOTE — Telephone Encounter (Signed)
Pt calling to give the Fax number, and that is [phone]

## 2015-03-09 NOTE — Telephone Encounter (Signed)
Spoke w/ pt.  Advised her of Dr. Windell Hummingbird recommendation.  She will call back w/ fax # to send office note to.

## 2015-03-12 ENCOUNTER — Telehealth: Payer: Self-pay

## 2015-03-12 NOTE — Telephone Encounter (Signed)
Dear Alejandra Hall,  I'm writing in response to your request for a letter detailing the past medical history, testing and prognosis for [name].   Alejandra Hall is a pleasant 54 year old woman with a long history of smoking who continues to smoke, currently unemployed on Medicaid, history of anxiety, who initially presented to establish care in the [place] office of [facility] after  non-ST elevation MI, during which she was transferred to [facility] from [facility] with LAD and RCA stent placement. I have seen her in March 2015, July 2015, more recently May 2016.  Her initial chest pain symptoms started October 2014. She had stuttering pains that became worse at the end of February 2015 during a snowstorm. She was driving from [facility] to [place] when she developed chest pain, arm pain, jaw pain. She was initially seen at [facility] and given the cardiac cath lab was closed, she was given thrombolytics, transferred to [facility] hospital where she underwent cardiac catheterization. She had DES (drug eluding stent ) placed to her LAD and RCA. Peak troponin the hospital was for 4.0 on 11/24/2013. She was started on aspirin and effient , antiplatelet medication   Echocardiogram at the time of her MI February 2015 showed mildly depressed ejection fraction at 40-45%  Details of her cardiac catheterization 11/25/2013: 1. Widely patent left main coronary artery. 2. Successful PCI of the proximal left anterior descending artery 3.5 x 12 Promus drug-eluting stent, postdilated to 3.8 mm in diameter 3. Patent left circumflex artery. 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 4. Successful, but complicated PCI of right coronary artery, requiring guideliner, with a 2.5 x 38 Promus drug-eluting stent, overlapping with a 2.75 x 16 promus drug-eluting stent more proximally. The entire stented segment was post dilated with a 3.0 noncompliant balloon distally and a 3.25  noncompliant balloon more proximally. 5. Normal left ventricular systolic function. LVEDP 12 mmHg. Ejection fraction 60%.  Since that time and on each of her visits as detailed above, she has been stable with no significant angina. she has rare episodes of chest pain relieved with albuterol,  She reports having arthritis in the mornings. Recently had many of her teeth pulled, stopped her effient for several weeks. She has GI workup pending at [facility] for possible Crohn's disease.  she feels she is gaining weight, eating lots of pasta.  Lab work reviewed with her showing total cholesterol 172, LDL 99 in September 2015.  EKG  shows normal sinus rhythm with rate 71 beats per minute, T wave abnormality in inferior leads consistent with old MI  In summary, [name] had a small MI in February 2015 with several stents placed. She has been stable since that time with no significant angina.  She does have other noncardiac issues such as anxiety, GI issues likely COPD. We have stressed to her the importance of smoking cessation No further cardiac testing needed at this time. We will see her back twice a year for routine office visits and medication adjustment.  Sincerely,  Dossie Arbour  M.D., Ph.D. [facility] [place], [place]

## 2015-03-12 NOTE — Telephone Encounter (Signed)
Pt states she needs a letter, not her medical records, stating she is disabled and cannot go back to work, so she can get her disabilty

## 2015-04-03 ENCOUNTER — Other Ambulatory Visit: Payer: Self-pay | Admitting: *Deleted

## 2015-04-03 MED ORDER — PRASUGREL HCL 10 MG PO TABS: 10.0000 mg | ORAL_TABLET | Freq: Every day | ORAL | Status: DC

## 2015-04-06 ENCOUNTER — Telehealth: Payer: Self-pay | Admitting: *Deleted

## 2015-04-06 NOTE — Telephone Encounter (Signed)
Pt calling stating that she went for a colonoscopy  And when she went to do it she mentioned, she was having arm in her pains Some pain under chin and in her chest  She says dr Mariah Milling knows of all this and said that she was fine.  But they would not do the procedure on her until she gets this fixed She is a little upset for she could not do it She keeps saying dr Mariah Milling knows about this and that when She Takes inhaler and she feels better.  Please advise.  Only when she does anything physical then it happens.   Spoke to nurse added pt to Wednesday July 13th at 3:40pm

## 2015-04-11 ENCOUNTER — Encounter: Payer: Self-pay | Admitting: Cardiovascular Disease

## 2015-04-11 ENCOUNTER — Ambulatory Visit: Payer: Medicaid Other | Admitting: Cardiovascular Disease

## 2015-04-11 ENCOUNTER — Ambulatory Visit (INDEPENDENT_AMBULATORY_CARE_PROVIDER_SITE_OTHER): Payer: Medicaid Other | Admitting: Cardiovascular Disease

## 2015-04-11 VITALS — BP 114/70 | HR 64 | Ht 63.0 in | Wt 154.5 lb

## 2015-04-11 DIAGNOSIS — I2510 Atherosclerotic heart disease of native coronary artery without angina pectoris: Secondary | ICD-10-CM | POA: Diagnosis not present

## 2015-04-11 DIAGNOSIS — E785 Hyperlipidemia, unspecified: Secondary | ICD-10-CM

## 2015-04-11 DIAGNOSIS — R079 Chest pain, unspecified: Secondary | ICD-10-CM | POA: Diagnosis not present

## 2015-04-11 DIAGNOSIS — Z720 Tobacco use: Secondary | ICD-10-CM

## 2015-04-11 DIAGNOSIS — I209 Angina pectoris, unspecified: Secondary | ICD-10-CM | POA: Diagnosis not present

## 2015-04-11 DIAGNOSIS — F17200 Nicotine dependence, unspecified, uncomplicated: Secondary | ICD-10-CM

## 2015-04-11 DIAGNOSIS — K5090 Crohn's disease, unspecified, without complications: Secondary | ICD-10-CM

## 2015-04-11 NOTE — Patient Instructions (Addendum)
You are doing well. No medication changes were made.  We will order a treadmill myoview for chest  Pain/angina  Please call us if you have new issues that need to be addressed before your next appt.  Your physician wants you to follow-up in: 6 months.  You will receive a reminder letter in the mail two months in advance. If you don't receive a letter, please call our office to schedule the follow-up appointment.  [facility] MYOVIEW  Your caregiver has ordered a Stress Test with nuclear imaging. The purpose of this test is to evaluate the blood supply to your heart muscle. This procedure is referred to as a "Non-Invasive Stress Test." This is because other than having an IV started in your vein, nothing is inserted or "invades" your body. Cardiac stress tests are done to find areas of poor blood flow to the heart by determining the extent of coronary artery disease (CAD). Some patients exercise on a treadmill, which naturally increases the blood flow to your heart, while others who are  unable to walk on a treadmill due to physical limitations have a pharmacologic/chemical stress agent called Lexiscan . This medicine will mimic walking on a treadmill by temporarily increasing your coronary blood flow.   Please note: these test may take anywhere between 2-4 hours to complete  PLEASE REPORT TO [facility] MEDICAL MALL ENTRANCE  THE VOLUNTEERS AT THE FIRST DESK WILL DIRECT YOU WHERE TO GO  Date of Procedure:_____Thursday, July 28__________  Arrival Time for Procedure:____8:45 am__________  Instructions regarding medication:   __X__:  Hold CARVEDILOL the night before procedure and morning of procedure  How to prepare for your Myoview test:   Do not eat or drink after midnight  No caffeine for 24 hours prior to test  No smoking 24 hours prior to test.  Your medication may be taken with water.  If your doctor stopped a medication because of this test, do not take that medication.  Ladies, please do  not wear dresses.  Skirts or pants are appropriate. Please wear a short sleeve shirt.  No perfume, cologne or lotion.  Wear comfortable walking shoes. No heels!  Exercise Stress Electrocardiogram An exercise stress electrocardiogram is a test that is done to evaluate the blood supply to your heart. This test may also be called exercise stress electrocardiography. The test is done while you are walking on a treadmill. The goal of this test is to raise your heart rate. This test is done to find areas of poor blood flow to the heart by determining the extent of coronary artery disease (CAD).   CAD is defined as narrowing in one or more heart (coronary) arteries of more than 70%. If you have an abnormal test result, this may mean that you are not getting adequate blood flow to your heart during exercise. Additional testing may be needed to understand why your test was abnormal. LET [facility] CARE PROVIDER KNOW ABOUT:   Any allergies you have.  All medicines you are taking, including vitamins, herbs, eye drops, creams, and over-the-counter medicines.  Previous problems you or members of your family have had with the use of anesthetics.  Any blood disorders you have.  Previous surgeries you have had.  Medical conditions you have.  Possibility of pregnancy, if this applies. RISKS AND COMPLICATIONS Generally, this is a safe procedure. However, as with any procedure, complications can occur. Possible complications can include:  Pain or pressure in the following areas:  Chest.  Jaw or neck.  Between your shoulder blades.  Radiating down your left arm.  Dizziness or light-headedness.  Shortness of breath.  Increased or irregular heartbeats.  Nausea or vomiting.  Heart attack (rare). BEFORE THE PROCEDURE  Avoid all forms of caffeine 24 hours before your test or as directed by your health care provider. This includes coffee, tea (even decaffeinated tea), caffeinated sodas,  chocolate, cocoa, and certain pain medicines.  Follow your health care provider's instructions regarding eating and drinking before the test.  Take your medicines as directed at regular times with water unless instructed otherwise. Exceptions may include:  If you have diabetes, ask how you are to take your insulin or pills. It is common to adjust insulin dosing the morning of the test.  If you are taking beta-blocker medicines, it is important to talk to your health care provider about these medicines well before the date of your test. Taking beta-blocker medicines may interfere with the test. In some cases, these medicines need to be changed or stopped 24 hours or more before the test.  If you wear a nitroglycerin patch, it may need to be removed prior to the test. Ask your health care provider if the patch should be removed before the test.  If you use an inhaler for any breathing condition, bring it with you to the test.  If you are an outpatient, bring a snack so you can eat right after the stress phase of the test.  Do not smoke for 4 hours prior to the test or as directed by your health care provider.  Do not apply lotions, powders, creams, or oils on your chest prior to the test.  Wear loose-fitting clothes and comfortable shoes for the test. This test involves walking on a treadmill. PROCEDURE  Multiple patches (electrodes) will be put on your chest. If needed, small areas of your chest may have to be shaved to get better contact with the electrodes. Once the electrodes are attached to your body, multiple wires will be attached to the electrodes and your heart rate will be monitored.  Your heart will be monitored both at rest and while exercising.  You will walk on a treadmill. The treadmill will be started at a slow pace. The treadmill speed and incline will gradually be increased to raise your heart rate. AFTER THE PROCEDURE  Your heart rate and blood pressure will be  monitored after the test.  You may return to your normal schedule including diet, activities, and medicines, unless your health care provider tells you otherwise. Document Released: 09/12/2000 Document Revised: 09/20/2013 Document Reviewed: 05/23/2013 [facility] Patient Information 2015 [place], [place]. This information is not intended to replace advice given to you by your health care provider. Make sure you discuss any questions you have with your health care provider.

## 2015-04-11 NOTE — Assessment & Plan Note (Signed)
Long discussion with her today concerning her symptoms. Typical as well as atypical features. Stress test has been ordered. If this shows no ischemia, could proceed with colonoscopy

## 2015-04-11 NOTE — Assessment & Plan Note (Signed)
Encouraged her to stay on her simvastatin 40 mg daily 

## 2015-04-11 NOTE — Progress Notes (Signed)
Patient ID: Alejandra Hall, female    DOB: [birth year], 54 y.o.   MRN: [id number]  HPI Comments: Alejandra Hall is a pleasant 54 year old woman with a long history of smoking who continues to smoke, currently unemployed, on Medicaid, history of anxiety, history of non-ST elevation MI, transferred to [place] from [facility] with LAD and RCA stent placement. She presents for routine followup of her coronary artery disease  In follow-up today, she reports that she was seen by GI/anesthesia at [facility]. Colonoscopy was canceled by the patient's report secondary to her chest discomfort. On her last clinic visit she reported having chest pain relieved with albuterol. She reports symptoms are still the same, relieved with one or 2 puffs of Proventil She no longer takes Advair, reporting this made her muscles sore She continues to smoke less than one half pack per day  arthritis in the mornings.  teeth pulled prior to her last clinic visit, stopped her effient for several weeks.  EKG on today's visit was not performed as patient declined  Other past medical history Lab work reviewed with her showing total cholesterol  172, LDL 99 in September 2015. Elevated white blood cell count of 21 in October 2015 that returned down to normal December 2015. CRP of 2  Prior chest pain symptoms started in October 2014. She had stuttering pains with worsening pains at the end of February 2015 during a snowstorm. She was driving from [facility] to [place] when she developed chest pain, arm pain, jaw pain. she was given thrombolytics, transferred to [place] where she underwent cardiac catheterization. She had DES placed to her LAD and RCA.  Peak troponin the hospital was for 4.0 on 11/24/2013.     Allergies  Allergen Reactions  . Metronidazole Nausea Only    Joint and body aches, dizziness     Current Outpatient Prescriptions on File Prior to Visit  Medication Sig Dispense Refill  . carvedilol (COREG) 3.125 MG  tablet Take 1 tablet (3.125 mg total) by mouth 2 (two) times daily with a meal. 60 tablet 3  . nitroGLYCERIN (NITROSTAT) 0.4 MG SL tablet Place 1 tablet (0.4 mg total) under the tongue every 5 (five) minutes as needed for chest pain. 25 tablet 3  . prasugrel (EFFIENT) 10 MG TABS tablet Take 1 tablet (10 mg total) by mouth daily. 30 tablet 3  . simvastatin (ZOCOR) 40 MG tablet Take 1 tablet (40 mg total) by mouth at bedtime. 90 tablet 3   No current facility-administered medications on file prior to visit.    Past Medical History  Diagnosis Date  . Borderline type 2 diabetes mellitus 11/24/2013  . Crohn's disease 11/24/2013  . Hypertension   . CAD (coronary artery disease)     s/p inferior STEMI; s/p PCI to LAD and RCA  . MI (myocardial infarction) 2015    Past Surgical History  Procedure Laterality Date  . Partial hysterectomy    . Appendectomy    . Cesarean section      X 2  . Cardiac catheterization      s/p inferior STEMI; s/p PCI to LAD and RCA  . Coronary angioplasty      s/p inferior STEMI; s/p PCI to LAD and RCA  . Left heart catheterization with coronary angiogram N/A 11/25/2013    Procedure: LEFT HEART CATHETERIZATION WITH CORONARY ANGIOGRAM;  Surgeon: Corky Crafts, MD;  Location: [facility] CATH LAB;  Service: Cardiovascular;  Laterality: N/A;  . Fractional flow reserve wire Right 11/25/2013  Procedure: FRACTIONAL FLOW RESERVE WIRE;  Surgeon: Corky Crafts, MD;  Location: [facility] CATH LAB;  Service: Cardiovascular;  Laterality: Right;  . Percutaneous coronary stent intervention (pci-s)  11/25/2013    Procedure: PERCUTANEOUS CORONARY STENT INTERVENTION (PCI-S);  Surgeon: Corky Crafts, MD;  Location: [facility] CATH LAB;  Service: Cardiovascular;;    Social History  reports that she has been smoking Cigarettes.  She has been smoking about 0.50 packs per day. She has never used smokeless tobacco. She reports that she does not drink alcohol or use illicit drugs.  Family  History family history includes Diabetes in her mother; Hypertension in her mother; Stroke in her maternal grandfather.   Review of Systems  Constitutional: Negative.   Respiratory: Positive for chest tightness.   Cardiovascular: Positive for chest pain.  Gastrointestinal: Negative.   Musculoskeletal: Negative.   Skin: Negative.   Neurological: Negative.   Hematological: Negative.   Psychiatric/Behavioral: Negative.   All other systems reviewed and are negative.   BP 114/70 mmHg  Pulse 64  Ht 5\' 3"  (1.6 m)  Wt 154 lb 8 oz (70.081 kg)  BMI 27.38 kg/m2  Physical Exam  Constitutional: She is oriented to person, place, and time. She appears well-developed and well-nourished.  HENT:  Head: Normocephalic.  Nose: Nose normal.  Mouth/Throat: Oropharynx is clear and moist.  Eyes: Conjunctivae are normal. Pupils are equal, round, and reactive to light.  Neck: Normal range of motion. Neck supple. No JVD present.  Cardiovascular: Normal rate, regular rhythm, S1 normal, S2 normal, normal heart sounds and intact distal pulses.  Exam reveals no gallop and no friction rub.   No murmur heard. Pulmonary/Chest: Effort normal and breath sounds normal. No respiratory distress. She has no wheezes. She has no rales. She exhibits no tenderness.  Abdominal: Soft. Bowel sounds are normal. She exhibits no distension. There is no tenderness.  Musculoskeletal: Normal range of motion. She exhibits no edema or tenderness.  Lymphadenopathy:    She has no cervical adenopathy.  Neurological: She is alert and oriented to person, place, and time. Coordination normal.  Skin: Skin is warm and dry. No rash noted. No erythema.  Psychiatric: She has a normal mood and affect. Her behavior is normal. Judgment and thought content normal.    Assessment and Plan  Nursing note and vitals reviewed.

## 2015-04-11 NOTE — Assessment & Plan Note (Signed)
In an effort to provide clearance for her colonoscopy, stress Myoview test has been ordered. Symptoms relieved with albuterol. No medication changes made at this time Recommended smoking cessation

## 2015-04-11 NOTE — Assessment & Plan Note (Signed)
Recent cancellation of her colonoscopy at [facility] for chest pain symptoms per the patient

## 2015-04-11 NOTE — Assessment & Plan Note (Signed)
We have encouraged her to continue to work on weaning her cigarettes and smoking cessation. She will continue to work on this and does not want any assistance with chantix.  

## 2015-04-26 ENCOUNTER — Telehealth: Payer: Self-pay

## 2015-04-26 ENCOUNTER — Ambulatory Visit
Admission: RE | Admit: 2015-04-26 | Discharge: 2015-04-26 | Disposition: A | Discharge status: Home / Self Care | Payer: Medicaid Other | Source: Ambulatory Visit | Attending: Cardiovascular Disease | Admitting: Cardiovascular Disease

## 2015-04-26 DIAGNOSIS — R9439 Abnormal result of other cardiovascular function study: Secondary | ICD-10-CM

## 2015-04-26 DIAGNOSIS — I209 Angina pectoris, unspecified: Secondary | ICD-10-CM | POA: Diagnosis present

## 2015-04-26 DIAGNOSIS — R079 Chest pain, unspecified: Secondary | ICD-10-CM

## 2015-04-26 DIAGNOSIS — Z01812 Encounter for preprocedural laboratory examination: Secondary | ICD-10-CM

## 2015-04-26 HISTORY — DX: Chronic obstructive pulmonary disease, unspecified: J44.9

## 2015-04-26 LAB — NM MYOCAR MULTI W/SPECT W/WALL MOTION / EF
Estimated workload: 4.3 METS
Exercise duration (min): 2 min
Exercise duration (sec): 14 s
LV dias vol: 47 mL
LV sys vol: 20 mL
MPHR: 165 {beats}/min
NUC STRESS TID: 1.32
Peak HR: 121 {beats}/min
Percent HR: 73 %
Rest HR: 88 {beats}/min
SDS: 0
SRS: 6
SSS: 2

## 2015-04-26 MED ORDER — TECHNETIUM TC 99M SESTAMIBI - CARDIOLITE
30.0000 | Freq: Once | INTRAVENOUS | Status: AC | PRN
  Administered 2015-04-26: 31.43 via INTRAVENOUS

## 2015-04-26 MED ORDER — REGADENOSON 0.4 MG/5ML IV SOLN: 0.4000 mg | Freq: Once | INTRAVENOUS | Status: DC

## 2015-04-26 MED ORDER — TECHNETIUM TC 99M SESTAMIBI - CARDIOLITE
13.0100 | Freq: Once | INTRAVENOUS | Status: AC | PRN
  Administered 2015-04-26: 13.01 via INTRAVENOUS

## 2015-04-26 NOTE — Telephone Encounter (Signed)
Spoke w/pt.  Advised her that Dr. Mariah Milling recommends that she proceed w/ cardiac cath, as she was symptomatic during her myoview this am.  She is sched for cath 05/10/15 @ 8:30. She is sched for pre-cath labs tomorrow @ 9:00, will go over instructions at that time.

## 2015-04-27 ENCOUNTER — Ambulatory Visit
Admission: RE | Admit: 2015-04-27 | Discharge: 2015-04-27 | Disposition: A | Discharge status: Home / Self Care | Payer: Medicaid Other | Source: Ambulatory Visit | Attending: Cardiovascular Disease | Admitting: Cardiovascular Disease

## 2015-04-27 ENCOUNTER — Other Ambulatory Visit (INDEPENDENT_AMBULATORY_CARE_PROVIDER_SITE_OTHER): Payer: Medicaid Other | Admitting: *Deleted

## 2015-04-27 DIAGNOSIS — R079 Chest pain, unspecified: Secondary | ICD-10-CM

## 2015-04-27 DIAGNOSIS — F17200 Nicotine dependence, unspecified, uncomplicated: Secondary | ICD-10-CM | POA: Insufficient documentation

## 2015-04-27 DIAGNOSIS — Z01812 Encounter for preprocedural laboratory examination: Secondary | ICD-10-CM

## 2015-04-27 DIAGNOSIS — I252 Old myocardial infarction: Secondary | ICD-10-CM | POA: Diagnosis not present

## 2015-04-27 DIAGNOSIS — R9439 Abnormal result of other cardiovascular function study: Secondary | ICD-10-CM

## 2015-04-27 DIAGNOSIS — J449 Chronic obstructive pulmonary disease, unspecified: Secondary | ICD-10-CM | POA: Diagnosis present

## 2015-04-28 LAB — CBC WITH DIFFERENTIAL/PLATELET
BASOS: 0 %
Basophils Absolute: 0 10*3/uL (ref 0.0–0.2)
EOS (ABSOLUTE): 0.2 10*3/uL (ref 0.0–0.4)
Eos: 2 %
Hematocrit: 36.1 % (ref 34.0–46.6)
Hemoglobin: 11.8 g/dL (ref 11.1–15.9)
Immature Grans (Abs): 0 10*3/uL (ref 0.0–0.1)
Immature Granulocytes: 0 %
LYMPHS ABS: 1.9 10*3/uL (ref 0.7–3.1)
Lymphs: 23 %
MCH: 28.6 pg (ref 26.6–33.0)
MCHC: 32.7 g/dL (ref 31.5–35.7)
MCV: 88 fL (ref 79–97)
MONOS ABS: 0.7 10*3/uL (ref 0.1–0.9)
Monocytes: 8 %
Neutrophils Absolute: 5.6 10*3/uL (ref 1.4–7.0)
Neutrophils: 67 %
PLATELETS: 323 10*3/uL (ref 150–379)
RBC: 4.12 x10E6/uL (ref 3.77–5.28)
RDW: 16.6 % — ABNORMAL HIGH (ref 12.3–15.4)
WBC: 8.5 10*3/uL (ref 3.4–10.8)

## 2015-04-28 LAB — BASIC METABOLIC PANEL
BUN/Creatinine Ratio: 19 (ref 9–23)
BUN: 15 mg/dL (ref 6–24)
CALCIUM: 8.8 mg/dL (ref 8.7–10.2)
CO2: 26 mmol/L (ref 18–29)
CREATININE: 0.78 mg/dL (ref 0.57–1.00)
Chloride: 105 mmol/L (ref 97–108)
GFR calc Af Amer: 99 mL/min/{1.73_m2} (ref >59)
GFR calc non Af Amer: 86 mL/min/{1.73_m2} (ref >59)
GLUCOSE: 102 mg/dL — AB (ref 65–99)
POTASSIUM: 4.6 mmol/L (ref 3.5–5.2)
SODIUM: 144 mmol/L (ref 134–144)

## 2015-04-28 LAB — PROTIME-INR
INR: 1 (ref 0.8–1.2)
PROTHROMBIN TIME: 10 s (ref 9.1–12.0)

## 2015-04-30 ENCOUNTER — Telehealth: Payer: Self-pay | Admitting: *Deleted

## 2015-04-30 NOTE — Telephone Encounter (Signed)
Spoke w/ pt.  Reviewed chest x-ray results w/ her.  She states that she was told years ago that she had a spot on her lungs and she wanted to know if this had gotten any bigger.  She states that she has a URI and has been coughing up phlegm that "looks like chicken fat". She has called her PCP and is waiting for rx for abx.

## 2015-04-30 NOTE — Telephone Encounter (Signed)
Pt calling for test results, stress test and chest x-ray Please call patient when they are ready.  She understood we would call when ready.

## 2015-05-01 ENCOUNTER — Telehealth: Payer: Self-pay

## 2015-05-01 NOTE — Telephone Encounter (Signed)
Request from Disabilty Determination Services , sent to [employer] on 05/01/2015.  

## 2015-05-01 NOTE — Telephone Encounter (Signed)
Disregard previous phone note, request from Disaility Determination [employer]. -ERROR  Request from [employer], [facility] Attorney at [place] , sent to [employer] on 05/01/2015 .

## 2015-05-09 ENCOUNTER — Other Ambulatory Visit: Payer: Self-pay | Admitting: Cardiovascular Disease

## 2015-05-09 DIAGNOSIS — I209 Angina pectoris, unspecified: Secondary | ICD-10-CM

## 2015-05-10 ENCOUNTER — Encounter
Admission: RE | Disposition: A | Discharge status: Home / Self Care | Payer: Self-pay | Source: Ambulatory Visit | Attending: Cardiovascular Disease

## 2015-05-10 ENCOUNTER — Encounter: Payer: Self-pay | Admitting: *Deleted

## 2015-05-10 ENCOUNTER — Ambulatory Visit
Admission: RE | Admit: 2015-05-10 | Discharge: 2015-05-10 | Disposition: A | Discharge status: Home / Self Care | Payer: Medicaid Other | Source: Ambulatory Visit | Attending: Cardiovascular Disease | Admitting: Cardiovascular Disease

## 2015-05-10 DIAGNOSIS — I252 Old myocardial infarction: Secondary | ICD-10-CM | POA: Diagnosis not present

## 2015-05-10 DIAGNOSIS — I2510 Atherosclerotic heart disease of native coronary artery without angina pectoris: Secondary | ICD-10-CM

## 2015-05-10 DIAGNOSIS — E785 Hyperlipidemia, unspecified: Secondary | ICD-10-CM | POA: Diagnosis present

## 2015-05-10 DIAGNOSIS — F17200 Nicotine dependence, unspecified, uncomplicated: Secondary | ICD-10-CM | POA: Diagnosis present

## 2015-05-10 DIAGNOSIS — K5090 Crohn's disease, unspecified, without complications: Secondary | ICD-10-CM | POA: Diagnosis not present

## 2015-05-10 DIAGNOSIS — R9439 Abnormal result of other cardiovascular function study: Secondary | ICD-10-CM | POA: Insufficient documentation

## 2015-05-10 DIAGNOSIS — Z888 Allergy status to other drugs, medicaments and biological substances status: Secondary | ICD-10-CM | POA: Diagnosis not present

## 2015-05-10 DIAGNOSIS — E119 Type 2 diabetes mellitus without complications: Secondary | ICD-10-CM | POA: Diagnosis not present

## 2015-05-10 DIAGNOSIS — I10 Essential (primary) hypertension: Secondary | ICD-10-CM | POA: Diagnosis not present

## 2015-05-10 DIAGNOSIS — I25118 Atherosclerotic heart disease of native coronary artery with other forms of angina pectoris: Secondary | ICD-10-CM | POA: Diagnosis present

## 2015-05-10 DIAGNOSIS — Z79899 Other long term (current) drug therapy: Secondary | ICD-10-CM | POA: Insufficient documentation

## 2015-05-10 DIAGNOSIS — Z955 Presence of coronary angioplasty implant and graft: Secondary | ICD-10-CM | POA: Insufficient documentation

## 2015-05-10 DIAGNOSIS — I200 Unstable angina: Secondary | ICD-10-CM | POA: Diagnosis present

## 2015-05-10 DIAGNOSIS — I209 Angina pectoris, unspecified: Secondary | ICD-10-CM | POA: Diagnosis present

## 2015-05-10 HISTORY — PX: CARDIAC CATHETERIZATION: SHX172

## 2015-05-10 SURGERY — LEFT HEART CATH AND CORONARY ANGIOGRAPHY
Anesthesia: Moderate Sedation

## 2015-05-10 SURGERY — LEFT HEART CATH AND CORONARY ANGIOGRAPHY
Anesthesia: Moderate Sedation | Laterality: Bilateral

## 2015-05-10 MED ORDER — ASPIRIN 81 MG PO CHEW: 81.0000 mg | CHEWABLE_TABLET | ORAL | Status: DC

## 2015-05-10 MED ORDER — BACITRACIN-NEOMYCIN-POLYMYXIN 400-5-5000 EX OINT
TOPICAL_OINTMENT | CUTANEOUS | Status: AC
  Filled 2015-05-10: qty 1

## 2015-05-10 MED ORDER — IOHEXOL 300 MG/ML  SOLN
INTRAMUSCULAR | Status: DC | PRN
  Administered 2015-05-10: 80 mL via INTRA_ARTERIAL
  Administered 2015-05-10: 30 mL via INTRA_ARTERIAL

## 2015-05-10 MED ORDER — SODIUM CHLORIDE 0.9 % WEIGHT BASED INFUSION
3.0000 mL/kg/h | INTRAVENOUS | Status: DC
  Administered 2015-05-10: 3 mL/kg/h via INTRAVENOUS

## 2015-05-10 MED ORDER — MIDAZOLAM HCL 2 MG/2ML IJ SOLN
INTRAMUSCULAR | Status: AC
  Filled 2015-05-10: qty 2

## 2015-05-10 MED ORDER — MIDAZOLAM HCL 2 MG/2ML IJ SOLN
INTRAMUSCULAR | Status: DC | PRN
  Administered 2015-05-10: 0.5 mg via INTRAVENOUS
  Administered 2015-05-10: 1 mg via INTRAVENOUS

## 2015-05-10 MED ORDER — FENTANYL CITRATE (PF) 100 MCG/2ML IJ SOLN
INTRAMUSCULAR | Status: AC
  Filled 2015-05-10: qty 2

## 2015-05-10 MED ORDER — HEPARIN (PORCINE) IN NACL 2-0.9 UNIT/ML-% IJ SOLN
INTRAMUSCULAR | Status: AC
  Filled 2015-05-10: qty 1000

## 2015-05-10 MED ORDER — SODIUM CHLORIDE 0.9 % WEIGHT BASED INFUSION: 1.0000 mL/kg/h | INTRAVENOUS | Status: DC

## 2015-05-10 MED ORDER — FENTANYL CITRATE (PF) 100 MCG/2ML IJ SOLN
INTRAMUSCULAR | Status: DC | PRN
  Administered 2015-05-10: 50 ug via INTRAVENOUS
  Administered 2015-05-10 (×2): 25 ug via INTRAVENOUS

## 2015-05-10 SURGICAL SUPPLY — 11 items
CATH INFINITI 5 FR 3DRC (CATHETERS) ×3 IMPLANT
CATH INFINITI 5FR ANG PIGTAIL (CATHETERS) ×3 IMPLANT
CATH INFINITI 5FR JL4 (CATHETERS) ×3 IMPLANT
CATH INFINITI JR4 5F (CATHETERS) ×3 IMPLANT
DEVICE CLOSURE MYNXGRIP 5F (Vascular Products) ×3 IMPLANT
KIT MANI 3VAL PERCEP (MISCELLANEOUS) ×3 IMPLANT
NEEDLE PERC 18GX7CM (NEEDLE) ×3 IMPLANT
NEEDLE SMART 18G ACCESS (NEEDLE) ×3 IMPLANT
PACK CARDIAC CATH (CUSTOM PROCEDURE TRAY) ×3 IMPLANT
SHEATH AVANTI 5FR X 11CM (SHEATH) ×3 IMPLANT
WIRE EMERALD 3MM-J .035X150CM (WIRE) ×3 IMPLANT

## 2015-05-10 NOTE — Discharge Instructions (Signed)
Groin Insertion Instructions-If you lose feeling or develop tingling or pain in your leg or foot after the procedure, please walk around first.  If the discomfort does not improve , contact your physician and proceed to the nearest emergency room.  Loss of feeling in your leg might mean that a blockage has formed in the artery and this can be appropriately treated.  Limit your activity for the next two days after your procedure.  Avoid stooping, bending, heavy lifting or exertion as this may put pressure on the insertion site.  Resume normal activities in 48 hours.  You may shower after 24 hours but avoid excessive warm water and do not scrub the site.  Remove clear dressing in 48 hours.  If you have had a closure device inserted, do not soak in a tub bath or a hot tub for at least one week. ° °No driving for 48 hours after discharge.  After the procedure, check the insertion site occasionally.  If any oozing occurs or there is apparent swelling, firm pressure over the site will prevent a bruise from forming.  You can not hurt anything by pressing directly on the site.  The pressure stops the bleeding by allowing a small clot to form.  If the bleeding continues after the pressure has been applied for more than 15 minutes, call 911 or go to the nearest emergency room.   ° °The x-ray dye causes you to pass a considerate amount of urine.  For this reason, you will be asked to drink plenty of liquids after the procedure to prevent dehydration.  You may resume you regular diet.  Avoid caffeine products.   ° °For pain at the site of your procedure, take non-aspirin medicines such as Tylenol. ° °Medications: A. Hold Metformin for 48 hours if applicable.  B. Continue taking all your present medications at home unless your doctor prescribes any changes.Groin Insertion Instructions-If you lose feeling or develop tingling or pain in your leg or foot after the procedure, please walk around first.  If the discomfort does not  improve , contact your physician and proceed to the nearest emergency room.  Loss of feeling in your leg might mean that a blockage has formed in the artery and this can be appropriately treated.  Limit your activity for the next two days after your procedure.  Avoid stooping, bending, heavy lifting or exertion as this may put pressure on the insertion site.  Resume normal activities in 48 hours.  You may shower after 24 hours but avoid excessive warm water and do not scrub the site.  Remove clear dressing in 48 hours.  If you have had a closure device inserted, do not soak in a tub bath or a hot tub for at least one week. ° °No driving for 48 hours after discharge.  After the procedure, check the insertion site occasionally.  If any oozing occurs or there is apparent swelling, firm pressure over the site will prevent a bruise from forming.  You can not hurt anything by pressing directly on the site.  The pressure stops the bleeding by allowing a small clot to form.  If the bleeding continues after the pressure has been applied for more than 15 minutes, call 911 or go to the nearest emergency room.   ° °The x-ray dye causes you to pass a considerate amount of urine.  For this reason, you will be asked to drink plenty of liquids after the procedure to prevent dehydration.  You   may resume you regular diet.  Avoid caffeine products.   ° °For pain at the site of your procedure, take non-aspirin medicines such as Tylenol. ° °Medications: A. Hold Metformin for 48 hours if applicable.  B. Continue taking all your present medications at home unless your doctor prescribes any changes. °

## 2015-05-17 ENCOUNTER — Other Ambulatory Visit: Payer: Self-pay | Admitting: Cardiovascular Disease

## 2015-05-22 ENCOUNTER — Ambulatory Visit (INDEPENDENT_AMBULATORY_CARE_PROVIDER_SITE_OTHER): Payer: Medicaid Other | Admitting: Nurse Practitioner

## 2015-05-22 ENCOUNTER — Encounter: Payer: Self-pay | Admitting: Nurse Practitioner

## 2015-05-22 VITALS — BP 100/70 | HR 88 | Ht 63.0 in | Wt 154.0 lb

## 2015-05-22 DIAGNOSIS — Z720 Tobacco use: Secondary | ICD-10-CM | POA: Insufficient documentation

## 2015-05-22 DIAGNOSIS — I208 Other forms of angina pectoris: Secondary | ICD-10-CM

## 2015-05-22 DIAGNOSIS — I10 Essential (primary) hypertension: Secondary | ICD-10-CM

## 2015-05-22 DIAGNOSIS — E785 Hyperlipidemia, unspecified: Secondary | ICD-10-CM

## 2015-05-22 DIAGNOSIS — I25119 Atherosclerotic heart disease of native coronary artery with unspecified angina pectoris: Secondary | ICD-10-CM | POA: Diagnosis not present

## 2015-05-22 MED ORDER — CARVEDILOL 3.125 MG PO TABS: 1.5600 mg | ORAL_TABLET | Freq: Two times a day (BID) | ORAL | Status: DC

## 2015-05-22 MED ORDER — ISOSORBIDE MONONITRATE ER 30 MG PO TB24: 15.0000 mg | ORAL_TABLET | Freq: Every day | ORAL | Status: DC

## 2015-05-22 NOTE — Patient Instructions (Addendum)
Medication Instructions:  Your physician has recommended you make the following change in your medication:  DECREASE your coreg to 1/2 tablet twice per day START taking Imdur 15mg  once per day   Labwork: none  Testing/Procedures: none  Follow-Up: Your physician recommends that you schedule a follow-up appointment in: six weeks with Eula Listen, PA-C or Dr. Mariah Milling   Any Other Special Instructions Will Be Listed Below (If Applicable).

## 2015-05-22 NOTE — Progress Notes (Signed)
Patient Name: Alejandra Hall Date of Encounter: 05/22/2015  Primary Care Provider:  Griffith Citron, MD Primary Cardiologist:  Concha Se, MD   Chief Complaint  54 y/o female with a h/o CAD s/p prior LAD and RCA stenting who presents today for f/u after recent catheterization r/t unstable angina.  Past Medical History   Past Medical History  Diagnosis Date  . Borderline type 2 diabetes mellitus 11/24/2013  . Crohn's disease 11/24/2013  . Essential hypertension   . CAD (coronary artery disease)     a. 10/2013 s/p inferior STEMI/PCI: LAD (3.5x12 Promus DES), RCA (2.5x38 Promus DES & 2.75x16 Promus DES), OM2 100 in inf branch w/ L->L collats;  b. 04/2015 Ex MV: no ischemia. Test stopped due to severe c/p (injected w/ Ss);  c. 04/2015 Cath: LM 10, LAD 30ost, patent stent, D1 50ost, 30 diff, LCX nl, OM2 100 CTO of inferior branch with L-L collats (2.1mm vessel->Med Rx), RCA 10ost ISR.   Marland Kitchen COPD (chronic obstructive pulmonary disease)   . Tobacco abuse    Past Surgical History  Procedure Laterality Date  . Partial hysterectomy    . Appendectomy    . Cesarean section      X 2  . Cardiac catheterization      s/p inferior STEMI; s/p PCI to LAD and RCA  . Coronary angioplasty      s/p inferior STEMI; s/p PCI to LAD and RCA  . Left heart catheterization with coronary angiogram N/A 11/25/2013    Procedure: LEFT HEART CATHETERIZATION WITH CORONARY ANGIOGRAM;  Surgeon: Corky Crafts, MD;  Location: [facility] CATH LAB;  Service: Cardiovascular;  Laterality: N/A;  . Fractional flow reserve wire Right 11/25/2013    Procedure: FRACTIONAL FLOW RESERVE WIRE;  Surgeon: Corky Crafts, MD;  Location: [facility] CATH LAB;  Service: Cardiovascular;  Laterality: Right;  . Percutaneous coronary stent intervention (pci-s)  11/25/2013    Procedure: PERCUTANEOUS CORONARY STENT INTERVENTION (PCI-S);  Surgeon: Corky Crafts, MD;  Location: [facility] CATH LAB;  Service: Cardiovascular;;  . Cardiac catheterization N/A  05/10/2015    Procedure: Left Heart Cath and Coronary Angiography;  Surgeon: Antonieta Iba, MD;  Location: [facility] INVASIVE CV LAB;  Service: Cardiovascular;  Laterality: N/A;    Allergies  Allergies  Allergen Reactions  . Metronidazole Nausea Only    Joint and body aches, dizziness     HPI  54 y/o female with a prior h/o CAD s/p NSTEMI in 10/2013 with PCI/DES of the RCA and LAD @ that time.  She has been on effient since.  She continues to smoke but has cut back to 5 cigarettes/day.  She recently saw Dr. Mariah Milling on 7/13 with c/o exertional c/p.  She was also seeking cardiac clearance for a colonoscopy @ the time.  She subsequently underwent exercise stress testing, during which she developed severe chest pain and dyspnea at very low level exercise (stage I).  She did not achieve target HR and was injected with Ss.  Imaging did not reveal active ischemia but in the setting of profound exertional Ss, decision was made to pursue cath. This was performed on 8/11 and showed patent LAD and RCA stents with a CTO of the inferior branch of the OM2 with L  L collaterals - similar to what was seen in 10/2013.  Medical therapy was recommended as the OM2 was a small vessel and previous attempt to wire past the occlusion in 10/2013 was unsuccessful.  Since cath, she has done reasonably  well.  She continues to experience exertional c/p, especially with higher levels of activity, but overall the frequency and duration of her Ss are less.  That said, she also says that she is trying to do less b/c she knows that activity will likely result in c/p.  She has never taken a sl NTG and c/p usually resolves within 5-10 mins of resting.  She denies palpitations, pnd, orthopnea, n, v, dizziness, syncope, edema, weight gain, or early satiety.   Home Medications  Prior to Admission medications   Medication Sig Start Date End Date Taking? Authorizing Provider  ADVAIR DISKUS 250-50 MCG/DOSE AEPB Inhale 1 puff into the lungs as  needed.  03/31/15  Yes Historical Provider, MD  aspirin 325 MG tablet Take 325 mg by mouth daily.   Yes Historical Provider, MD  carvedilol (COREG) 3.125 MG tablet Take 1 tablet (3.125 mg total) by mouth 2 (two) times daily with a meal. 01/01/15  Yes Antonieta Iba, MD  [facility] IRON 325 (65 FE) MG tablet Take 325 mg by mouth daily.  02/20/15  Yes Historical Provider, MD  EFFIENT 10 MG TABS tablet TAKE 1 TABLET BY MOUTH EVERY DAY 05/18/15  Yes Antonieta Iba, MD  nitroGLYCERIN (NITROSTAT) 0.4 MG SL tablet Place 1 tablet (0.4 mg total) under the tongue every 5 (five) minutes as needed for chest pain. 04/28/14  Yes Antonieta Iba, MD  pantoprazole (PROTONIX) 20 MG tablet Take 20 mg by mouth as needed.  04/05/15  Yes Historical Provider, MD  prasugrel (EFFIENT) 10 MG TABS tablet Take 1 tablet (10 mg total) by mouth daily. 04/03/15  Yes Antonieta Iba, MD  PROAIR HFA 108 (90 BASE) MCG/ACT inhaler Inhale 1 puff into the lungs as needed.  02/24/15  Yes Historical Provider, MD  simvastatin (ZOCOR) 40 MG tablet Take 1 tablet (40 mg total) by mouth at bedtime. 02/06/15  Yes Antonieta Iba, MD  vitamin B-12 (CYANOCOBALAMIN) 500 MCG tablet Take 500 mcg by mouth daily.   Yes Historical Provider, MD    Review of Systems  Exertional c/p and dyspnea as outlined above.  All other systems reviewed and are otherwise negative except as noted above.  Physical Exam  VS:  BP 100/70 mmHg  Pulse 88  Ht  (1.6 m)  Wt 154 lb (69.854 kg)  BMI 27.29 kg/m2  LMP  (Approximate) , BMI Body mass index is 27.29 kg/(m^2). GEN: Well nourished, well developed, in no acute distress. HEENT: normal. Neck: Supple, no JVD, carotid bruits, or masses. Cardiac: RRR, no murmurs, rubs, or gallops. No clubbing, cyanosis, edema.  Radials/DP/PT 2+ and equal bilaterally.   Respiratory:  Respirations regular and unlabored, clear to auscultation bilaterally. GI: Soft, nontender, nondistended, BS + x 4. MS: no deformity or atrophy. Skin:  warm and dry, no rash. Neuro:  Strength and sensation are intact. Psych: Normal affect.  Accessory Clinical Findings  ECG - RSR, 88, no acute st/t changes.  Assessment & Plan  1.  Stable Angina/CAD:  S/p recent cath revealing stable anatomy with patent LAD and RCA stents and known occlusion of the inferior branch of the OM2 with L L collaterals.  She continues to have intermittent exertional sscp and dyspnea, though overall her burden has decreased some.  She has reduced her activity level however.  Her BP is soft on coreg 3.125mg  bid.  I have asked her to reduce this to 1/2 tab BID and I will add imdur 15 mg daily in hopes of  providing some relief from angina.  She will otw continue on statin and effient therapy (on board since PCI in 10/2013).  We discussed dropping her ASA dose to 81 mg daily, however she prefers to take 325 mg daily b/c of a h/o headaches.  2.  Essential HTN:  BP is soft.  Reducing bb as above to make some room for long-acting nitrate therapy.  3.  HL:  LDL 71 in 10/2013.  Nl lft's in 06/2014.  Cont statin therapy.  4.  Tobacco Abuse:  We discussed the importance of cessation and I provided counseling for 5 minutes.  We discussed the importance of establishing a quit date.  She had previously tried chantix but for now would like to continue to cut back and try quitting cold [place].  5.  COPD:  No active wheezing.  Cont home inhalers.  6. Dispo:  F/u with Dr. Mariah Milling in 6 wks to assess effectiveness and tolerance of imdur.   Nicolasa Ducking, NP 05/22/2015, 3:06 PM

## 2015-06-18 ENCOUNTER — Encounter: Payer: Self-pay | Admitting: Cardiology

## 2015-06-18 ENCOUNTER — Encounter: Payer: Self-pay | Admitting: Cardiovascular Disease

## 2015-07-12 ENCOUNTER — Encounter: Payer: Self-pay | Admitting: Cardiovascular Disease

## 2015-07-12 ENCOUNTER — Ambulatory Visit (INDEPENDENT_AMBULATORY_CARE_PROVIDER_SITE_OTHER): Payer: Medicaid Other | Admitting: Cardiovascular Disease

## 2015-07-12 VITALS — BP 112/70 | HR 74 | Ht 63.0 in | Wt 163.0 lb

## 2015-07-12 DIAGNOSIS — I213 ST elevation (STEMI) myocardial infarction of unspecified site: Secondary | ICD-10-CM

## 2015-07-12 DIAGNOSIS — E785 Hyperlipidemia, unspecified: Secondary | ICD-10-CM

## 2015-07-12 DIAGNOSIS — R079 Chest pain, unspecified: Secondary | ICD-10-CM

## 2015-07-12 DIAGNOSIS — I10 Essential (primary) hypertension: Secondary | ICD-10-CM

## 2015-07-12 DIAGNOSIS — E669 Obesity, unspecified: Secondary | ICD-10-CM

## 2015-07-12 DIAGNOSIS — M79602 Pain in left arm: Secondary | ICD-10-CM

## 2015-07-12 DIAGNOSIS — I2510 Atherosclerotic heart disease of native coronary artery without angina pectoris: Secondary | ICD-10-CM

## 2015-07-12 DIAGNOSIS — I209 Angina pectoris, unspecified: Secondary | ICD-10-CM

## 2015-07-12 DIAGNOSIS — Z720 Tobacco use: Secondary | ICD-10-CM

## 2015-07-12 DIAGNOSIS — F17200 Nicotine dependence, unspecified, uncomplicated: Secondary | ICD-10-CM

## 2015-07-12 MED ORDER — NITROGLYCERIN 0.4 MG SL SUBL: 0.4000 mg | SUBLINGUAL_TABLET | SUBLINGUAL | Status: DC | PRN

## 2015-07-12 NOTE — Progress Notes (Signed)
Patient ID: Alejandra Hall, female    DOB: 22-Jul-1961, 54 y.o.   MRN: [id number]  HPI Comments: Alejandra Hall is a pleasant 54 year old woman with a long history of smoking who continues to smoke, currently unemployed, on Medicaid, history of anxiety, history of non-ST elevation MI, transferred to [place] from [facility] with LAD and RCA stent placement. She presents for routine followup of her coronary artery disease 04/2015 Cath: LM 10, LAD 30ost, patent stent, D1 50ost, 30 diff, LCX nl, OM2 100 CTO of inferior branch with L-L collats (2.60mm vessel->Med Rx), RCA 10ost ISR.  In the past, chest pain symptoms resolved with inhaler  In follow-up today, she reports that she is doing well. She continues to have chronic mild chest discomfort radiating down both arms when she does heavy walking Feet surgically and since the mornings, then walking back to the house sometimes has chest discomfort. This is been a chronic issue Unclear if it is improved on low-dose isosorbide 15 mg daily. Headaches are better She continues to smoke 3-8 cigarettes per day Recent epsiode of vomiting x 1, unclear what caused her symptoms Eating more, lots of potatoes  EKG today shows normal sinus rhythm with rate 44 bpm, no significant ST or T-wave changes  Other past medical history Previously seen by GI/anesthesia at [facility]. Colonoscopy was canceled by the patient's report secondary to her chest discomfort. Previous chest pain relieved with albuterol. She reports symptoms are still the same, relieved with one or 2 puffs of Proventil She no longer takes Advair, reporting this made her muscles sore  arthritis in the mornings.  teeth pulled prior to her last clinic visit, stopped her effient for several weeks.  Lab work reviewed with her showing total cholesterol  172, LDL 99 in September 2015. Elevated white blood cell count of 21 in October 2015 that returned down to normal December 2015. CRP of 2  Prior chest pain  symptoms started in October 2014. She had stuttering pains with worsening pains at the end of February 2015 during a snowstorm. She was driving from [facility] to [facility] when she developed chest pain, arm pain, jaw pain. she was given thrombolytics, transferred to [place] where she underwent cardiac catheterization. She had DES placed to her LAD and RCA.  Peak troponin the hospital was for 4.0 on 11/24/2013.     Allergies  Allergen Reactions  . Metronidazole Nausea Only    Joint and body aches, dizziness     Current Outpatient Prescriptions on File Prior to Visit  Medication Sig Dispense Refill  . ADVAIR DISKUS 250-50 MCG/DOSE AEPB Inhale 1 puff into the lungs as needed.     Marland Kitchen aspirin 325 MG tablet Take 325 mg by mouth daily.    . carvedilol (COREG) 3.125 MG tablet Take 0.5 tablets (1.56 mg total) by mouth 2 (two) times daily with a meal. 30 tablet 3  . [facility] IRON 325 (65 FE) MG tablet Take 325 mg by mouth daily.     Marland Kitchen EFFIENT 10 MG TABS tablet TAKE 1 TABLET BY MOUTH EVERY DAY 30 tablet 6  . isosorbide mononitrate (IMDUR) 30 MG 24 hr tablet Take 0.5 tablets (15 mg total) by mouth daily. 30 tablet 3  . nitroGLYCERIN (NITROSTAT) 0.4 MG SL tablet Place 1 tablet (0.4 mg total) under the tongue every 5 (five) minutes as needed for chest pain. 25 tablet 3  . pantoprazole (PROTONIX) 20 MG tablet Take 20 mg by mouth as needed.     Marland Kitchen  prasugrel (EFFIENT) 10 MG TABS tablet Take 1 tablet (10 mg total) by mouth daily. 30 tablet 3  . PROAIR HFA 108 (90 BASE) MCG/ACT inhaler Inhale 1 puff into the lungs as needed.     . simvastatin (ZOCOR) 40 MG tablet Take 1 tablet (40 mg total) by mouth at bedtime. 90 tablet 3  . vitamin B-12 (CYANOCOBALAMIN) 500 MCG tablet Take 500 mcg by mouth daily.     No current facility-administered medications on file prior to visit.    Past Medical History  Diagnosis Date  . Borderline type 2 diabetes mellitus 11/24/2013  . Crohn's disease (HCC) 11/24/2013  .  Essential hypertension   . CAD (coronary artery disease)     a. 10/2013 s/p inferior STEMI/PCI: LAD (3.5x12 Promus DES), RCA (2.5x38 Promus DES & 2.75x16 Promus DES), OM2 100 in inf branch w/ L->L collats;  b. 04/2015 Ex MV: no ischemia. Test stopped due to severe c/p (injected w/ Ss);  c. 04/2015 Cath: LM 10, LAD 30ost, patent stent, D1 50ost, 30 diff, LCX nl, OM2 100 CTO of inferior branch with L-L collats (2.19mm vessel->Med Rx), RCA 10ost ISR.   Marland Kitchen COPD (chronic obstructive pulmonary disease) (HCC)   . Tobacco abuse     Past Surgical History  Procedure Laterality Date  . Partial hysterectomy    . Appendectomy    . Cesarean section      X 2  . Cardiac catheterization      s/p inferior STEMI; s/p PCI to LAD and RCA  . Coronary angioplasty      s/p inferior STEMI; s/p PCI to LAD and RCA  . Left heart catheterization with coronary angiogram N/A 11/25/2013    Procedure: LEFT HEART CATHETERIZATION WITH CORONARY ANGIOGRAM;  Surgeon: Corky Crafts, MD;  Location: [facility] CATH LAB;  Service: Cardiovascular;  Laterality: N/A;  . Fractional flow reserve wire Right 11/25/2013    Procedure: FRACTIONAL FLOW RESERVE WIRE;  Surgeon: Corky Crafts, MD;  Location[facility] CATH LAB;  Service: Cardiovascular;  Laterality: Right;  . Percutaneous coronary stent intervention (pci-s)  11/25/2013    Procedure: PERCUTANEOUS CORONARY STENT INTERVENTION (PCI-S);  Surgeon: Corky Crafts, MD;  Location: [facility] CATH LAB;  Service: Cardiovascular;;  . Cardiac catheterization N/A 05/10/2015    Procedure: Left Heart Cath and Coronary Angiography;  Surgeon: Antonieta Iba, MD;  Location: [facility] INVASIVE CV LAB;  Service: Cardiovascular;  Laterality: N/A;    Social History  reports that she has been smoking Cigarettes.  She has been smoking about 0.50 packs per day. She has never used smokeless tobacco. She reports that she does not drink alcohol or use illicit drugs.  Family History family history includes Diabetes in  her mother; Hypertension in her mother; Stroke in her maternal grandfather.   Review of Systems  Constitutional: Negative.   Respiratory: Positive for chest tightness.   Cardiovascular: Positive for chest pain.  Gastrointestinal: Negative.   Musculoskeletal: Negative.   Neurological: Negative.   Hematological: Negative.   Psychiatric/Behavioral: Negative.   All other systems reviewed and are negative.   BP 112/70 mmHg  Pulse 74  Ht  (1.6 m)  Wt 163 lb (73.936 kg)  BMI 28.88 kg/m2  SpO2 96%  LMP  (Approximate)  Physical Exam  Constitutional: She is oriented to person, place, and time. She appears well-developed and well-nourished.  HENT:  Head: Normocephalic.  Nose: Nose normal.  Mouth/Throat: Oropharynx is clear and moist.  Eyes: Conjunctivae are normal. Pupils are equal, round,  and reactive to light.  Neck: Normal range of motion. Neck supple. No JVD present.  Cardiovascular: Normal rate, regular rhythm, S1 normal, S2 normal, normal heart sounds and intact distal pulses.  Exam reveals no gallop and no friction rub.   No murmur heard. Pulmonary/Chest: Effort normal and breath sounds normal. No respiratory distress. She has no wheezes. She has no rales. She exhibits no tenderness.  Abdominal: Soft. Bowel sounds are normal. She exhibits no distension. There is no tenderness.  Musculoskeletal: Normal range of motion. She exhibits no edema or tenderness.  Lymphadenopathy:    She has no cervical adenopathy.  Neurological: She is alert and oriented to person, place, and time. Coordination normal.  Skin: Skin is warm and dry. No rash noted. No erythema.  Psychiatric: She has a normal mood and affect. Her behavior is normal. Judgment and thought content normal.    Assessment and Plan  Nursing note and vitals reviewed.

## 2015-07-12 NOTE — Assessment & Plan Note (Signed)
Stable coronary disease, prior MI, collaterals. Recommended she try to increase isosorbide up to 15 mg twice a day. Could retry 30 mg in the evening

## 2015-07-12 NOTE — Patient Instructions (Addendum)
You are doing well.  Try to decrease the aspirin down to 81 mg daily  Try to quit smoking  Please call us if you have new issues that need to be addressed before your next appt.  Your physician wants you to follow-up in: 6 months.  You will receive a reminder letter in the mail two months in advance. If you don't receive a letter, please call our office to schedule the follow-up appointment.  Steps to Quit Smoking  Smoking tobacco can be harmful to your health and can affect almost every organ in your body. Smoking puts you, and those around you, at risk for developing many serious chronic diseases. Quitting smoking is difficult, but it is one of the best things that you can do for your health. It is never too late to quit. WHAT ARE THE BENEFITS OF QUITTING SMOKING? When you quit smoking, you lower your risk of developing serious diseases and conditions, such as:  Lung cancer or lung disease, such as COPD.  Heart disease.  Stroke.  Heart attack.  Infertility.  Osteoporosis and bone fractures. Additionally, symptoms such as coughing, wheezing, and shortness of breath may get better when you quit. You may also find that you get sick less often because your body is stronger at fighting off colds and infections. If you are pregnant, quitting smoking can help to reduce your chances of having a baby of low birth weight. HOW DO I GET READY TO QUIT? When you decide to quit smoking, create a plan to make sure that you are successful. Before you quit:  Pick a date to quit. Set a date within the next two weeks to give you time to prepare.  Write down the reasons why you are quitting. Keep this list in places where you will see it often, such as on your bathroom mirror or in your car or wallet.  Identify the people, places, things, and activities that make you want to smoke (triggers) and avoid them. Make sure to take these actions:  Throw away all cigarettes at home, at work, and in your  car.  Throw away smoking accessories, such as Set designer.  Clean your car and make sure to empty the ashtray.  Clean your home, including curtains and carpets.  Tell your family, friends, and coworkers that you are quitting. Support from your loved ones can make quitting easier.  Talk with your health care provider about your options for quitting smoking.  Find out what treatment options are covered by your health insurance. WHAT STRATEGIES CAN I USE TO QUIT SMOKING?  Talk with your healthcare provider about different strategies to quit smoking. Some strategies include:  Quitting smoking altogether instead of gradually lessening how much you smoke over a period of time. Research shows that quitting "cold [place]" is more successful than gradually quitting.  Attending in-person counseling to help you build problem-solving skills. You are more likely to have success in quitting if you attend several counseling sessions. Even short sessions of 10 minutes can be effective.  Finding resources and support systems that can help you to quit smoking and remain smoke-free after you quit. These resources are most helpful when you use them often. They can include:  Online chats with a Veterinary surgeon.  Telephone quitlines.  Printed Materials engineer.  Support groups or group counseling.  Text messaging programs.  Mobile phone applications.  Taking medicines to help you quit smoking. (If you are pregnant or breastfeeding, talk with your health care provider  first.) Some medicines contain nicotine and some do not. Both types of medicines help with cravings, but the medicines that include nicotine help to relieve withdrawal symptoms. Your health care provider may recommend:  Nicotine patches, gum, or lozenges.  Nicotine inhalers or sprays.  Non-nicotine medicine that is taken by mouth. Talk with your health care provider about combining strategies, such as taking medicines while you  are also receiving in-person counseling. Using these two strategies together makes you more likely to succeed in quitting than if you used either strategy on its own. If you are pregnant or breastfeeding, talk with your health care provider about finding counseling or other support strategies to quit smoking. Do not take medicine to help you quit smoking unless told to do so by your health care provider. WHAT THINGS CAN I DO TO MAKE IT EASIER TO QUIT? Quitting smoking might feel overwhelming at first, but there is a lot that you can do to make it easier. Take these important actions:  Reach out to your family and friends and ask that they support and encourage you during this time. Call telephone quitlines, reach out to support groups, or work with a counselor for support.  Ask people who smoke to avoid smoking around you.  Avoid places that trigger you to smoke, such as bars, parties, or smoke-break areas at work.  Spend time around people who do not smoke.  Lessen stress in your life, because stress can be a smoking trigger for some people. To lessen stress, try:  Exercising regularly.  Deep-breathing exercises.  Yoga.  Meditating.  Performing a body scan. This involves closing your eyes, scanning your body from head to toe, and noticing which parts of your body are particularly tense. Purposefully relax the muscles in those areas.  Download or purchase mobile phone or tablet apps (applications) that can help you stick to your quit plan by providing reminders, tips, and encouragement. There are many free apps, such as QuitGuide from the [employer] developer for Disease Control and Prevention). You can find other support for quitting smoking (smoking cessation) through smokefree.gov and other websites. HOW WILL I FEEL WHEN I QUIT SMOKING? Within the first 24 hours of quitting smoking, you may start to feel some withdrawal symptoms. These symptoms are usually most noticeable 2-3 days after  quitting, but they usually do not last beyond 2-3 weeks. Changes or symptoms that you might experience include:  Mood swings.  Restlessness, anxiety, or irritation.  Difficulty concentrating.  Dizziness.  Strong cravings for sugary foods in addition to nicotine.  Mild weight gain.  Constipation.  Nausea.  Coughing or a sore throat.  Changes in how your medicines work in your body.  A depressed mood.  Difficulty sleeping (insomnia). After the first 2-3 weeks of quitting, you may start to notice more positive results, such as:  Improved sense of smell and taste.  Decreased coughing and sore throat.  Slower heart rate.  Lower blood pressure.  Clearer skin.  The ability to breathe more easily.  Fewer sick days. Quitting smoking is very challenging for most people. Do not get discouraged if you are not successful the first time. Some people need to make many attempts to quit before they achieve long-term success. Do your best to stick to your quit plan, and talk with your health care provider if you have any questions or concerns.   This information is not intended to replace advice given to you by your health care provider. Make sure you discuss  any questions you have with your health care provider.   Document Released: 09/09/2001 Document Revised: 01/30/2015 Document Reviewed: 01/30/2015 Elsevier Interactive Patient Education 2016 [employer]. Smoking Hazards Smoking cigarettes is extremely bad for your health. Tobacco smoke has over 200 known poisons in it. It contains the poisonous gases nitrogen oxide and carbon monoxide. There are over 60 chemicals in tobacco smoke that cause cancer. Some of the chemicals found in cigarette smoke include:   Cyanide.   Benzene.   Formaldehyde.   Methanol (wood alcohol).   Acetylene (fuel used in welding torches).   Ammonia.  Even smoking lightly shortens your life expectancy by several years. You can greatly reduce  the risk of medical problems for you and your family by stopping now. Smoking is the most preventable cause of death and disease in our society. Within days of quitting smoking, your circulation improves, you decrease the risk of having a heart attack, and your lung capacity improves. There may be some increased phlegm in the first few days after quitting, and it may take months for your lungs to clear up completely. Quitting for 10 years reduces your risk of developing lung cancer to almost that of a nonsmoker.  WHAT ARE THE RISKS OF SMOKING? Cigarette smokers have an increased risk of many serious medical problems, including:  Lung cancer.   Lung disease (such as pneumonia, bronchitis, and emphysema).   Heart attack and chest pain due to the heart not getting enough oxygen (angina).   Heart disease and peripheral blood vessel disease.   Hypertension.   Stroke.   Oral cancer (cancer of the lip, mouth, or voice box).   Bladder cancer.   Pancreatic cancer.   Cervical cancer.   Pregnancy complications, including premature birth.   Stillbirths and smaller newborn babies, birth defects, and genetic damage to sperm.   Early menopause.   Lower estrogen level for women.   Infertility.   Facial wrinkles.   Blindness.   Increased risk of broken bones (fractures).   Senile dementia.   Stomach ulcers and internal bleeding.   Delayed wound healing and increased risk of complications during surgery. Because of secondhand smoke exposure, children of smokers have an increased risk of the following:   Sudden infant death syndrome (SIDS).   Respiratory infections.   Lung cancer.   Heart disease.   Ear infections.  WHY IS SMOKING ADDICTIVE? Nicotine is the chemical agent in tobacco that is capable of causing addiction or dependence. When you smoke and inhale, nicotine is absorbed rapidly into the bloodstream through your lungs. Both inhaled and  noninhaled nicotine may be addictive.  WHAT ARE THE BENEFITS OF QUITTING?  There are many health benefits to quitting smoking. Some are:   The likelihood of developing cancer and heart disease decreases. Health improvements are seen almost immediately.   Blood pressure, pulse rate, and breathing patterns start returning to normal soon after quitting.   People who quit may see an improvement in their overall quality of life.  HOW DO YOU QUIT SMOKING? Smoking is an addiction with both physical and psychological effects, and longtime habits can be hard to change. Your health care provider can recommend:  Programs and community resources, which may include group support, education, or therapy.  Replacement products, such as patches, gum, and nasal sprays. Use these products only as directed. Do not replace cigarette smoking with electronic cigarettes (commonly called e-cigarettes). The safety of e-cigarettes is unknown, and some may contain harmful chemicals. FOR MORE INFORMATION  American Lung Association: www.lung.org  American Cancer Society: www.cancer.org   This information is not intended to replace advice given to you by your health care provider. Make sure you discuss any questions you have with your health care provider.   Document Released: 10/23/2004 Document Revised: 07/06/2013 Document Reviewed: 03/07/2013 Elsevier Interactive Patient Education [employer]

## 2015-07-12 NOTE — Assessment & Plan Note (Signed)
Cholesterol is at goal on the current lipid regimen. No changes to the medications were made. Recheck cholesterol in her next visit

## 2015-07-12 NOTE — Assessment & Plan Note (Signed)
Stable angina. Plan as above

## 2015-07-12 NOTE — Assessment & Plan Note (Signed)
Blood pressure is well controlled on today's visit. No changes made to the medications. 

## 2015-07-12 NOTE — Assessment & Plan Note (Signed)
Significant coronary disease as above, stable. Recent cardiac catheterization. No intervention Stressed importance of smoking cessation, change in her diet given her weight gain

## 2015-07-12 NOTE — Assessment & Plan Note (Signed)
We have encouraged continued exercise, careful diet management in an effort to lose weight. 

## 2015-07-12 NOTE — Assessment & Plan Note (Signed)
We have encouraged her to continue to work on weaning her cigarettes and smoking cessation. She will continue to work on this and does not want any assistance with chantix.  

## 2015-07-17 ENCOUNTER — Telehealth: Payer: Self-pay | Admitting: *Deleted

## 2015-07-17 NOTE — Telephone Encounter (Signed)
I do not see that Dr. Mariah Milling or anyone from our office has sent pt a letter recently.  Attempted to contact pt.  No answer, no machine.

## 2015-07-17 NOTE — Telephone Encounter (Signed)
Please call Alejandra Hall regarding a letter Dr. Mariah Milling sent her. She is very upset and stating the letter is incorrect. She has to be infront of the judge tomorrow 07/18/15 please call her stat!!!

## 2015-07-18 NOTE — Telephone Encounter (Signed)
Spoke w/ pt.  She states that the letter that Dr. Mariah Milling sent to her lawyer in June 2016 is incorrect. She is referencing the sentence:  "In summary, Ms. 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 She has been stable since that time with no significant angina."  She states that she has had chronic chest pain since she first saw Dr. Mariah Milling and that she has told him this on each visit.  Reviewed pt's complete chart w/ her, reading her reported sx from each visit since 2014. She states that she has had "heart attack" pain every day and that she must have forgotten to mention that to Dr. Mariah Milling. Reviewed pt's last cath report w/ her, as well.  She states that she was told that she has a blockage on the back of her heart and that it will take years for the collaterals to finish completion. She states that she feels this is where her pain comes from. She would like to schedule an appt to come in and speak w/ Dr. Mariah Milling again so that he can possibly addend this letter, his notes, or write her a new letter.  Pt sched to see Dr. Mariah Milling 08/13/15, but she would like to make him aware in case he is willing to send her lawyer another letter stating that she cannot work.

## 2015-07-18 NOTE — Telephone Encounter (Signed)
Attempted to contact pt. No answer, no machine.  

## 2015-08-02 ENCOUNTER — Other Ambulatory Visit: Payer: Self-pay

## 2015-08-03 ENCOUNTER — Telehealth: Payer: Self-pay

## 2015-08-03 MED ORDER — ISOSORBIDE MONONITRATE ER 30 MG PO TB24: 15.0000 mg | ORAL_TABLET | Freq: Every day | ORAL | Status: DC

## 2015-08-03 MED ORDER — ISOSORBIDE MONONITRATE ER 30 MG PO TB24: 30.0000 mg | ORAL_TABLET | Freq: Every day | ORAL | Status: DC

## 2015-08-03 NOTE — Telephone Encounter (Addendum)
Spoke w/ pt.  Dr. Mariah Milling advised her to increase her isosorbide to 30 mg daily (either 1/2 tab BID or whole tab in evening), but this was not reflected on her med list. Advised her that am sending to her pharmacy now.  She is appreciative and will call back w/ any questions or concerns.

## 2015-08-03 NOTE — Addendum Note (Signed)
Addended by: Rhea Belton R on: 08/03/2015 10:10 AM   Modules accepted: Orders

## 2015-08-03 NOTE — Telephone Encounter (Signed)
Pt called, states her isosorbide should be 1 tablet daily, states rx was called in for 1/2 pill daily. Pt needs to discuss this with someone. States rx needs to be changed for 1/2 in the morning and 1/2 at night. States she is completely out.

## 2015-08-06 NOTE — Telephone Encounter (Signed)
This encounter was created in error - please disregard.

## 2015-08-13 ENCOUNTER — Ambulatory Visit: Payer: Medicaid Other | Admitting: Cardiovascular Disease

## 2015-09-04 ENCOUNTER — Telehealth: Payer: Self-pay

## 2015-09-04 NOTE — Telephone Encounter (Signed)
Pt has some questions for Dr. Mariah Milling. She would like to know if she" is going to have CP for the rest of my life, and will I be on medication for the rest of my life." Please call and advise

## 2015-09-04 NOTE — Telephone Encounter (Signed)
Pt has appt in January to discuss.  She would like to make sure that there is documentation in her chart that she has daily chest pain.

## 2015-09-05 ENCOUNTER — Telehealth: Payer: Self-pay | Admitting: *Deleted

## 2015-09-05 NOTE — Telephone Encounter (Signed)
Patient calling to check suggestion about knee swelling.  See previous note.  She wants to make sure this is not a blood clot

## 2015-09-05 NOTE — Telephone Encounter (Signed)
Spoke w/ pt.  She reports that she fell about a month ago and hurt her knee. Reports that she felt that her injury was getting better, but now left knee is swollen and hard to bend.  Pt denies redness or heat from area. She believes that it may be r/t her Crohn's disease, as this affects her joints. She is on Effient 10 mg and takes as prescribed. Advised her that her sx do not meet criteria for DVT, but if she feels that she needs to be evaluated, I can see if we can do a STAT u/s here in the office or [facility]. She declines, as she feels that her sx are Crohn's related. She will contact her PCP if her sx do not improve. She states that the main reason for her call is to see if Dr. Mariah Milling will write her a letter stating that she cannot work, as her house payment just went up and her husband is the only one in their house working. She also needs a letter to her lawyer stating that she has chest pain every day. Advised her that she has an appt next month to discuss this, but I am adding her to my wait list to call in the event of a cancellation. She is appreciative and will call back w/ any further questions or concerns.

## 2015-09-05 NOTE — Telephone Encounter (Signed)
Pt c/o swelling: STAT is pt has developed SOB within 24 hours  1. How long have you been experiencing swelling? Hurting about a week but thinks it may be a blood clot  2. Where is the swelling located? Left knee  3.  Are you currently taking a "fluid pill"? No she does not have any  4.  Are you currently SOB? No   5.  Have you traveled recently? No   States this happened a before. It is just swollen at the knee and not sure what she can do wants to make sure it is not a clot.  Please advise.

## 2015-10-04 ENCOUNTER — Other Ambulatory Visit: Payer: Self-pay | Admitting: Nurse Practitioner

## 2015-10-08 ENCOUNTER — Ambulatory Visit: Payer: Medicaid Other | Admitting: Cardiovascular Disease

## 2015-10-17 IMAGING — CR DG CHEST 2V
1 series · 2 of 2 positions shown · non-contrast
Comparison: Portable chest x-ray dated November 24, 2013

CLINICAL DATA: Pre cardiac catheterization chest x-ray, history of
previous MI and now dyspnea on exertion, history of COPD, current
smoker.

EXAM:
CHEST  2 VIEW

[Series 1: w chest pa · 0.14mm/px · 2 of 2 slices shown]
[im 1/2]
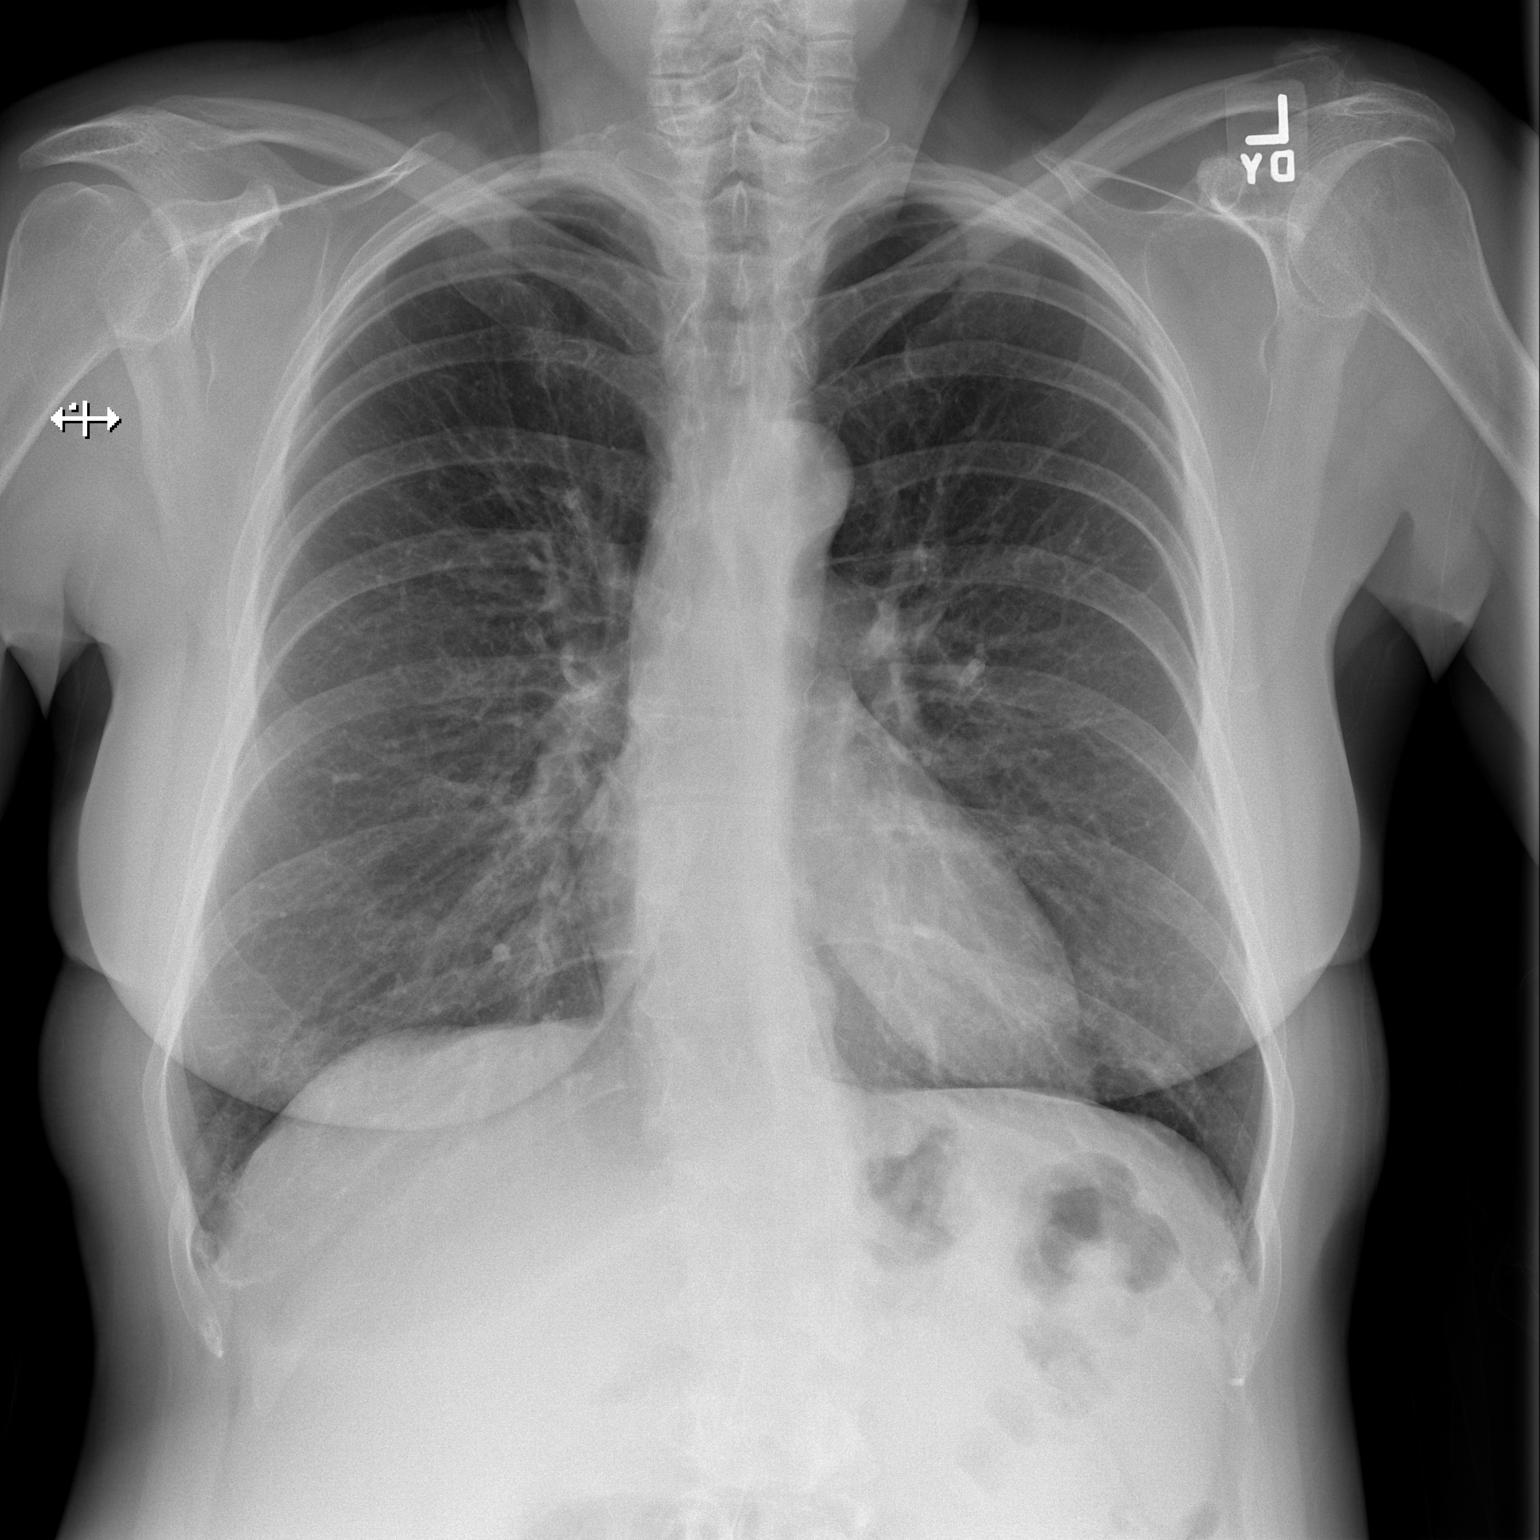
[im 2/2]
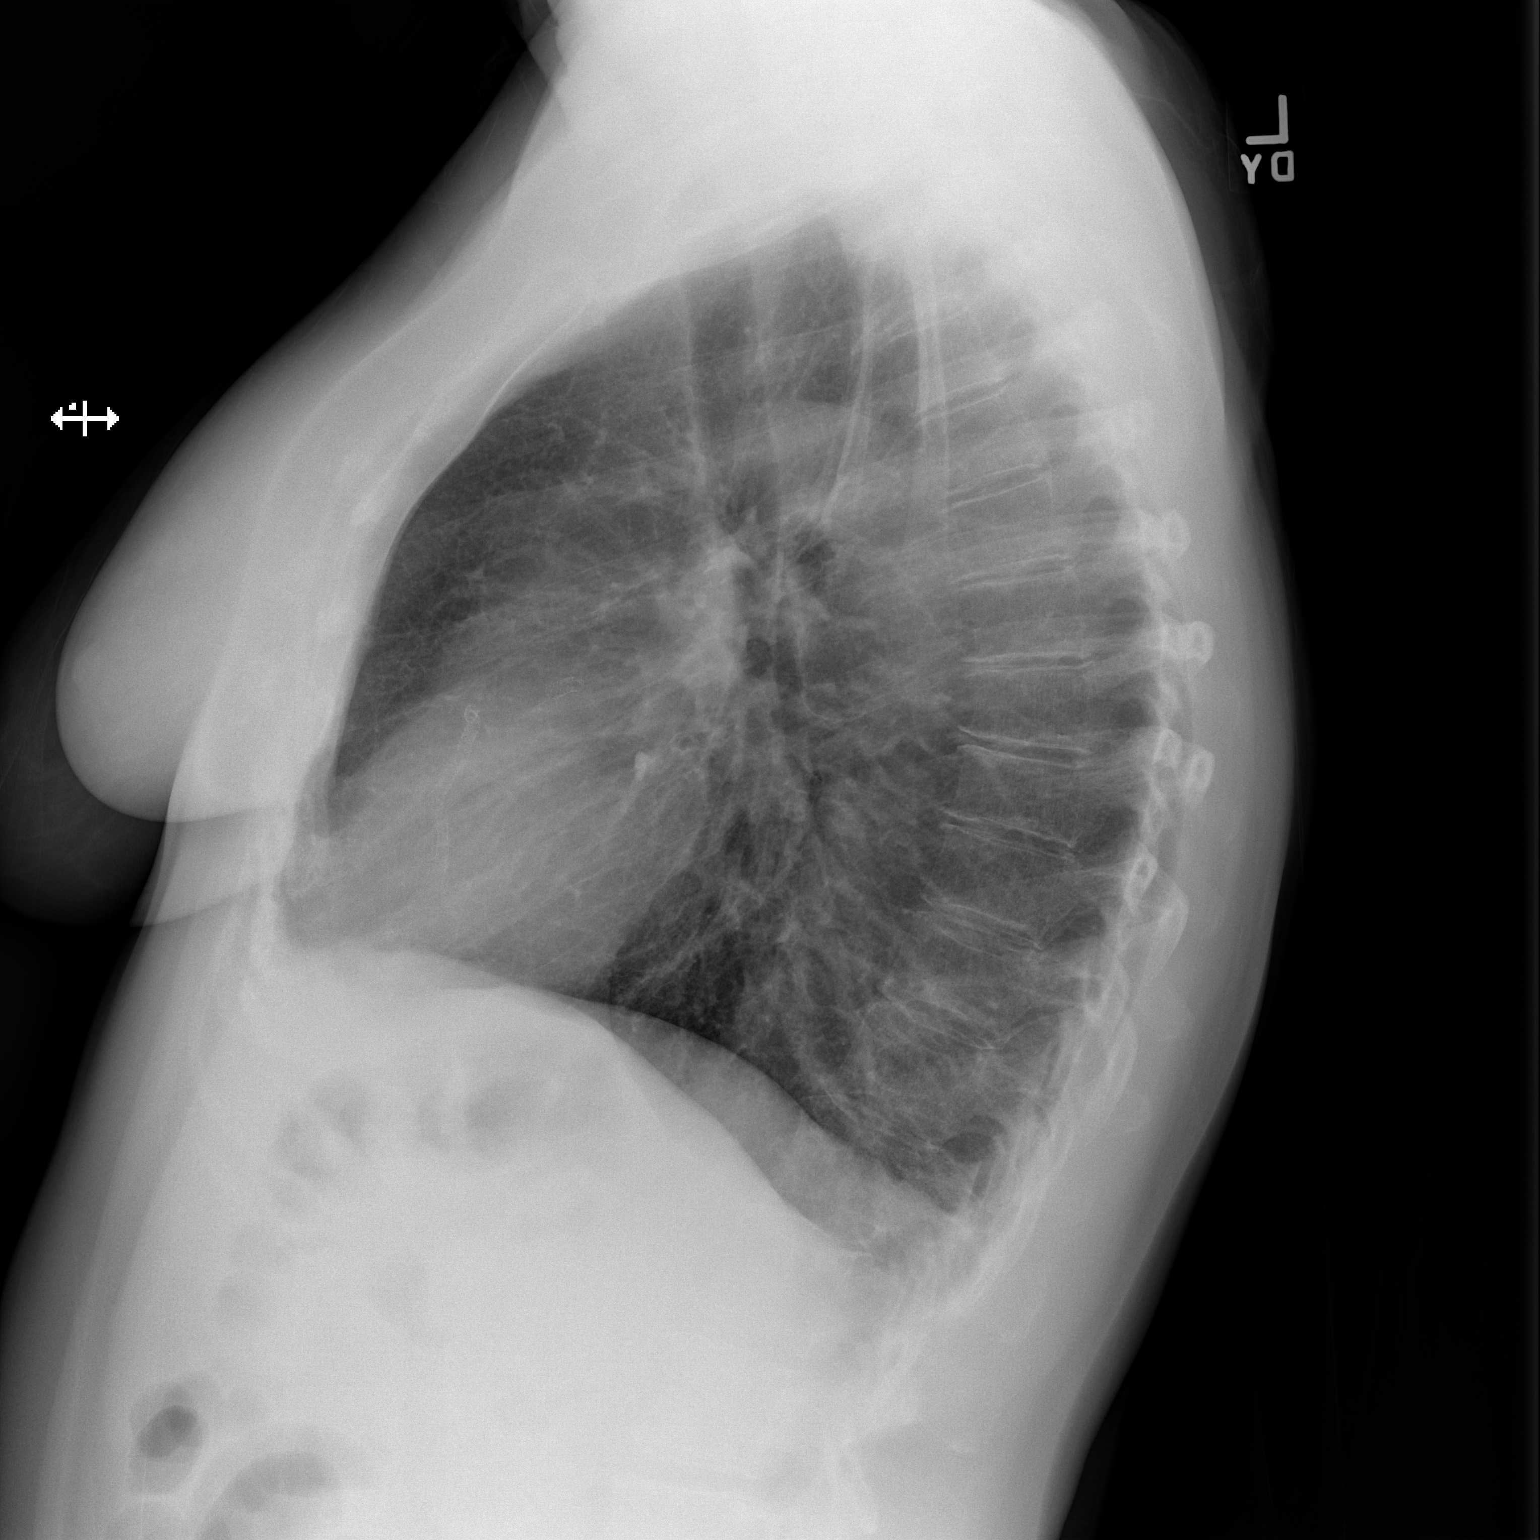

[2 of 2 positions shown; findings below may reference images not displayed]

FINDINGS: The lungs are well-expanded. There is no focal infiltrate. The
interstitial markings are mildly increased bilaterally. The heart
and pulmonary vascularity are normal. The mediastinum is normal in
width. There is no pleural effusion. The bony thorax exhibits no
acute abnormality.
IMPRESSION: COPD. Mild prominence of the interstitial markings likely reflects
the patient's smoking history. There is no CHF nor other acute
cardiopulmonary abnormality.

## 2015-11-22 ENCOUNTER — Encounter: Payer: Self-pay | Admitting: Cardiovascular Disease

## 2015-11-22 ENCOUNTER — Ambulatory Visit (INDEPENDENT_AMBULATORY_CARE_PROVIDER_SITE_OTHER): Payer: Medicaid Other | Admitting: Cardiovascular Disease

## 2015-11-22 VITALS — BP 120/80 | Ht 63.0 in | Wt 160.0 lb

## 2015-11-22 DIAGNOSIS — I2510 Atherosclerotic heart disease of native coronary artery without angina pectoris: Secondary | ICD-10-CM

## 2015-11-22 DIAGNOSIS — E669 Obesity, unspecified: Secondary | ICD-10-CM

## 2015-11-22 DIAGNOSIS — I213 ST elevation (STEMI) myocardial infarction of unspecified site: Secondary | ICD-10-CM

## 2015-11-22 DIAGNOSIS — Z720 Tobacco use: Secondary | ICD-10-CM | POA: Diagnosis not present

## 2015-11-22 DIAGNOSIS — I10 Essential (primary) hypertension: Secondary | ICD-10-CM

## 2015-11-22 DIAGNOSIS — F17200 Nicotine dependence, unspecified, uncomplicated: Secondary | ICD-10-CM

## 2015-11-22 NOTE — Patient Instructions (Signed)
You are doing well. No medication changes were made.  Please call us if you have new issues that need to be addressed before your next appt.  Your physician wants you to follow-up in: 6 months.  You will receive a reminder letter in the mail two months in advance. If you don't receive a letter, please call our office to schedule the follow-up appointment.   

## 2015-11-22 NOTE — Assessment & Plan Note (Signed)
Currently with no symptoms of angina. No further workup at this time. Continue current medication regimen. 

## 2015-11-22 NOTE — Progress Notes (Signed)
Patient ID: JAMIESON LISA, female    DOB: Feb 06, 1961, 55 y.o.   MRN: [id number]  HPI Comments: Ms. Tullis is a pleasant 55 year old woman with a long history of smoking who continues to smoke, currently unemployed, on Medicaid, history of anxiety, history of non-ST elevation MI, transferred to [place] from [facility] with LAD and RCA stent placement. She presents for routine followup of her coronary artery disease 04/2015 Cath: LM 10, LAD 30ost, patent stent, D1 50ost, 30 diff, LCX nl, OM2 100 CTO of inferior branch with L-L collats (2.91mm vessel->Med Rx), RCA 10ost ISR.  In the past, chest pain symptoms resolved with inhaler  In follow-up today, she reports that she is doing well, Smoking much less over the past year Rare episodes of chest discomfort, not typically associated with exertion Weight has been trending upwards No regular exercise program Eating more, lots of potatoes  EKG today shows normal sinus rhythm with rate 76 bpm, no significant ST or T-wave changes  Other past medical history Previously seen by GI/anesthesia at [facility]. Colonoscopy was canceled by the patient's report secondary to her chest discomfort. Previous chest pain relieved with albuterol. She reports symptoms are still the same, relieved with one or 2 puffs of Proventil She no longer takes Advair, reporting this made her muscles sore  arthritis in the mornings.  teeth pulled prior to her last clinic visit, stopped her effient for several weeks.  Lab work reviewed with her showing total cholesterol  172, LDL 99 in September 2015. Elevated white blood cell count of 21 in October 2015 that returned down to normal December 2015. CRP of 2  Prior chest pain symptoms started in October 2014. She had stuttering pains with worsening pains at the end of February 2015 during a snowstorm. She was driving from [facility] to [place] when she developed chest pain, arm pain, jaw pain. she was given thrombolytics, transferred  to [place] where she underwent cardiac catheterization. She had DES placed to her LAD and RCA.  Peak troponin the hospital was for 4.0 on 11/24/2013.     Allergies  Allergen Reactions  . Metronidazole Nausea Only    Joint and body aches, dizziness     Current Outpatient Prescriptions on File Prior to Visit  Medication Sig Dispense Refill  . ADVAIR DISKUS 250-50 MCG/DOSE AEPB Inhale 1 puff into the lungs as needed.     Marland Kitchen aspirin EC 81 MG tablet Take 1 tablet (81 mg total) by mouth daily. 90 tablet 3  . carvedilol (COREG) 3.125 MG tablet TAKE 1/2 TABLET (1.56 MG TOTAL) BY MOUTH 2 (TWO) TIMES DAILY WITH A MEAL. 30 tablet 6  . [facility] IRON 325 (65 FE) MG tablet Take 325 mg by mouth daily.     . isosorbide mononitrate (IMDUR) 30 MG 24 hr tablet Take 1 tablet (30 mg total) by mouth daily. 90 tablet 3  . nitroGLYCERIN (NITROSTAT) 0.4 MG SL tablet Place 1 tablet (0.4 mg total) under the tongue every 5 (five) minutes as needed for chest pain. 25 tablet 3  . pantoprazole (PROTONIX) 20 MG tablet Take 20 mg by mouth as needed.     . prasugrel (EFFIENT) 10 MG TABS tablet Take 1 tablet (10 mg total) by mouth daily. 30 tablet 3  . PROAIR HFA 108 (90 BASE) MCG/ACT inhaler Inhale 1 puff into the lungs as needed.     . simvastatin (ZOCOR) 40 MG tablet Take 1 tablet (40 mg total) by mouth at bedtime. 90 tablet  3  . vitamin B-12 (CYANOCOBALAMIN) 500 MCG tablet Take 500 mcg by mouth daily.     No current facility-administered medications on file prior to visit.    Past Medical History  Diagnosis Date  . Borderline type 2 diabetes mellitus 11/24/2013  . Crohn's disease (HCC) 11/24/2013  . Essential hypertension   . CAD (coronary artery disease)     a. 10/2013 s/p inferior STEMI/PCI: LAD (3.5x12 Promus DES), RCA (2.5x38 Promus DES & 2.75x16 Promus DES), OM2 100 in inf branch w/ L->L collats;  b. 04/2015 Ex MV: no ischemia. Test stopped due to severe c/p (injected w/ Ss);  c. 04/2015 Cath: LM 10, LAD 30ost,  patent stent, D1 50ost, 30 diff, LCX nl, OM2 100 CTO of inferior branch with L-L collats (2.104mm vessel->Med Rx), RCA 10ost ISR.   Marland Kitchen COPD (chronic obstructive pulmonary disease) (HCC)   . Tobacco abuse     Past Surgical History  Procedure Laterality Date  . Partial hysterectomy    . Appendectomy    . Cesarean section      X 2  . Cardiac catheterization      s/p inferior STEMI; s/p PCI to LAD and RCA  . Coronary angioplasty      s/p inferior STEMI; s/p PCI to LAD and RCA  . Left heart catheterization with coronary angiogram N/A 11/25/2013    Procedure: LEFT HEART CATHETERIZATION WITH CORONARY ANGIOGRAM;  Surgeon: Corky Crafts, MD;  Location: [facility] CATH LAB;  Service: Cardiovascular;  Laterality: N/A;  . Fractional flow reserve wire Right 11/25/2013    Procedure: FRACTIONAL FLOW RESERVE WIRE;  Surgeon: Corky Crafts, MD;  Location: [facility] CATH LAB;  Service: Cardiovascular;  Laterality: Right;  . Percutaneous coronary stent intervention (pci-s)  11/25/2013    Procedure: PERCUTANEOUS CORONARY STENT INTERVENTION (PCI-S);  Surgeon: Corky Crafts, MD;  Location: [facility] CATH LAB;  Service: Cardiovascular;;  . Cardiac catheterization N/A 05/10/2015    Procedure: Left Heart Cath and Coronary Angiography;  Surgeon: Antonieta Iba, MD;  Location: [facility] INVASIVE CV LAB;  Service: Cardiovascular;  Laterality: N/A;    Social History  reports that she has been smoking Cigarettes.  She has been smoking about 0.25 packs per day. She has never used smokeless tobacco. She reports that she does not drink alcohol or use illicit drugs.  Family History family history includes Diabetes in her mother; Hypertension in her mother; Stroke in her maternal grandfather.   Review of Systems  Constitutional: Negative.   Respiratory: Positive for chest tightness.   Cardiovascular: Negative.   Gastrointestinal: Negative.   Musculoskeletal: Negative.   Neurological: Negative.   Hematological: Negative.    Psychiatric/Behavioral: Negative.   All other systems reviewed and are negative.   BP 120/80 mmHg  Ht 5\' 3"  (1.6 m)  Wt 160 lb (72.576 kg)  BMI 28.35 kg/m2  LMP  (Approximate)  Physical Exam  Constitutional: She is oriented to person, place, and time. She appears well-developed and well-nourished.  HENT:  Head: Normocephalic.  Nose: Nose normal.  Mouth/Throat: Oropharynx is clear and moist.  Eyes: Conjunctivae are normal. Pupils are equal, round, and reactive to light.  Neck: Normal range of motion. Neck supple. No JVD present.  Cardiovascular: Normal rate, regular rhythm, S1 normal, S2 normal, normal heart sounds and intact distal pulses.  Exam reveals no gallop and no friction rub.   No murmur heard. Pulmonary/Chest: Effort normal and breath sounds normal. No respiratory distress. She has no wheezes. She has no rales. She  exhibits no tenderness.  Abdominal: Soft. Bowel sounds are normal. She exhibits no distension. There is no tenderness.  Musculoskeletal: Normal range of motion. She exhibits no edema or tenderness.  Lymphadenopathy:    She has no cervical adenopathy.  Neurological: She is alert and oriented to person, place, and time. Coordination normal.  Skin: Skin is warm and dry. No rash noted. No erythema.  Psychiatric: She has a normal mood and affect. Her behavior is normal. Judgment and thought content normal.    Assessment and Plan  Nursing note and vitals reviewed.

## 2015-11-22 NOTE — Assessment & Plan Note (Signed)
We have encouraged her to continue to work on weaning her cigarettes and smoking cessation. She will continue to work on this and does not want any assistance with chantix.  

## 2015-11-22 NOTE — Assessment & Plan Note (Signed)
Blood pressure is well controlled on today's visit. No changes made to the medications. 

## 2015-11-22 NOTE — Assessment & Plan Note (Signed)
Recommended a low carbohydrate diet Increasing her exercise

## 2015-12-12 ENCOUNTER — Other Ambulatory Visit: Payer: Self-pay | Admitting: Cardiovascular Disease

## 2016-02-26 ENCOUNTER — Telehealth: Payer: Self-pay | Admitting: Cardiovascular Disease

## 2016-02-26 NOTE — Telephone Encounter (Signed)
Okay to change to Plavix 75 mill grams daily, continue aspirin

## 2016-02-26 NOTE — Telephone Encounter (Signed)
Patient can't afford Effient and would like a Rx sent in for Plavix.  Please advise.

## 2016-02-26 NOTE — Telephone Encounter (Signed)
Pt calling stating she can't afford her Effient   Would like [place] to please send in some [facility] into [facility] [address]   Please advise.

## 2016-02-27 MED ORDER — CLOPIDOGREL BISULFATE 75 MG PO TABS: 75.0000 mg | ORAL_TABLET | Freq: Every day | ORAL | Status: DC

## 2016-02-27 NOTE — Telephone Encounter (Signed)
Spoke w/ pt.  Advised her of Dr. Gollan's recommendation. She is appreciative and will call back w/ any questions or concerns.  

## 2016-05-28 ENCOUNTER — Encounter: Payer: Self-pay | Admitting: Cardiovascular Disease

## 2016-05-28 ENCOUNTER — Ambulatory Visit (INDEPENDENT_AMBULATORY_CARE_PROVIDER_SITE_OTHER): Payer: Medicare Other | Admitting: Cardiovascular Disease

## 2016-05-28 VITALS — BP 100/58 | HR 84 | Ht 65.5 in | Wt 155.2 lb

## 2016-05-28 DIAGNOSIS — I209 Angina pectoris, unspecified: Secondary | ICD-10-CM

## 2016-05-28 DIAGNOSIS — F17200 Nicotine dependence, unspecified, uncomplicated: Secondary | ICD-10-CM

## 2016-05-28 DIAGNOSIS — R7303 Prediabetes: Secondary | ICD-10-CM

## 2016-05-28 DIAGNOSIS — I2510 Atherosclerotic heart disease of native coronary artery without angina pectoris: Secondary | ICD-10-CM

## 2016-05-28 DIAGNOSIS — I10 Essential (primary) hypertension: Secondary | ICD-10-CM

## 2016-05-28 DIAGNOSIS — I249 Acute ischemic heart disease, unspecified: Secondary | ICD-10-CM

## 2016-05-28 DIAGNOSIS — Z720 Tobacco use: Secondary | ICD-10-CM

## 2016-05-28 MED ORDER — CLOPIDOGREL BISULFATE 75 MG PO TABS: 75.0000 mg | ORAL_TABLET | Freq: Every day | ORAL | 11 refills | Status: DC

## 2016-05-28 MED ORDER — ISOSORBIDE MONONITRATE ER 30 MG PO TB24: 30.0000 mg | ORAL_TABLET | Freq: Every day | ORAL | 11 refills | Status: DC

## 2016-05-28 NOTE — Patient Instructions (Signed)

## 2016-05-28 NOTE — Progress Notes (Signed)
Cardiology Office Note  Date:  05/28/2016   ID:  HOLY COMO, DOB 26-Apr-1961, MRN [id number]  PCP:  Griffith Citron, MD   Chief Complaint  Patient presents with  . Other    6 month folllow up. Meds reviewed by the patient verbally. Pt. c/o chest pain, headache and left arm numbness.     HPI:  Ms. Macatangay is a pleasant 55 year old woman with a long history of smoking who continues to smoke, currently unemployed, history of anxiety, history of non-ST elevation MI, transferred to [place] from [facility] with LAD and RCA stent placement. She presents for routine followup of her coronary artery disease  04/2015 Cath: LM 10, LAD 30ost, patent stent, D1 50ost, 30 diff, LCX nl, OM2 100 CTO of inferior branch with L-L collats (2.67mm vessel->Med Rx), RCA 10ost ISR.  In the past, chest pain symptoms resolved with inhaler  In follow-up today, she reports that she is doing well, Smoking much less over the past year Smokes 3 -6 cig sometimes more Albuterol and advair helps chest pain, Riding bike for exercise, still gets some chest tightness, relieved after she takes deep breaths Reports having some chronic back pain, numbness left lower extremity Weight loss over the past year, trying to eat better  No recent lipid panel available  EKG today shows normal sinus rhythm with rate 84 bpm, no significant ST or T-wave changes  Other past medical history Previously seen by GI/anesthesia at [facility]. Colonoscopy was canceled by the patient's report secondary to her chest discomfort. Previous chest pain relieved with albuterol. She reports symptoms are still the same, relieved with one or 2 puffs of Proventil She no longer takes Advair, reporting this made her muscles sore  arthritis in the mornings.  teeth pulled prior to her last clinic visit, stopped her effient for several weeks.  Lab work reviewed with her showing total cholesterol  172, LDL 99 in September 2015. Elevated white blood cell  count of 21 in October 2015 that returned down to normal December 2015. CRP of 2  Prior chest pain symptoms started in October 2014. She had stuttering pains with worsening pains at the end of February 2015 during a snowstorm. She was driving from [facility] to [place] when she developed chest pain, arm pain, jaw pain. she was given thrombolytics, transferred to [address] where she underwent cardiac catheterization. She had DES placed to her LAD and RCA.  Peak troponin the hospital was for 4.0 on 11/24/2013.    PMH:   has a past medical history of Borderline type 2 diabetes mellitus (11/24/2013); CAD (coronary artery disease); COPD (chronic obstructive pulmonary disease) (HCC); Crohn's disease (HCC) (11/24/2013); Essential hypertension; and Tobacco abuse.  PSH:    Past Surgical History:  Procedure Laterality Date  . APPENDECTOMY    . CARDIAC CATHETERIZATION     s/p inferior STEMI; s/p PCI to LAD and RCA  . CARDIAC CATHETERIZATION N/A 05/10/2015   Procedure: Left Heart Cath and Coronary Angiography;  Surgeon: Antonieta Iba, MD;  Location: [facility] INVASIVE CV LAB;  Service: Cardiovascular;  Laterality: N/A;  . CESAREAN SECTION     X 2  . CORONARY ANGIOPLASTY     s/p inferior STEMI; s/p PCI to LAD and RCA  . FRACTIONAL FLOW RESERVE WIRE Right 11/25/2013   Procedure: FRACTIONAL FLOW RESERVE WIRE;  Surgeon: Corky Crafts, MD;  Location: [facility] CATH LAB;  Service: Cardiovascular;  Laterality: Right;  . LEFT HEART CATHETERIZATION WITH CORONARY ANGIOGRAM N/A 11/25/2013  Procedure: LEFT HEART CATHETERIZATION WITH CORONARY ANGIOGRAM;  Surgeon: Corky CraftsJayadeep S Varanasi, MD;  Location: [facility] CATH LAB;  Service: Cardiovascular;  Laterality: N/A;  . PARTIAL HYSTERECTOMY    . PERCUTANEOUS CORONARY STENT INTERVENTION (PCI-S)  11/25/2013   Procedure: PERCUTANEOUS CORONARY STENT INTERVENTION (PCI-S);  Surgeon: Corky CraftsJayadeep S Varanasi, MD;  [facility];  Service: Cardiovascular;;    Current Outpatient  Prescriptions  Medication Sig Dispense Refill  . ADVAIR DISKUS 250-50 MCG/DOSE AEPB Inhale 1 puff into the lungs as needed.     Marland Kitchen. aspirin EC 81 MG tablet Take 1 tablet (81 mg total) by mouth daily. 90 tablet 3  . carvedilol (COREG) 3.125 MG tablet TAKE 1/2 TABLET (1.56 MG TOTAL) BY MOUTH 2 (TWO) TIMES DAILY WITH A MEAL. 30 tablet 6  . clopidogrel (PLAVIX) 75 MG tablet Take 1 tablet (75 mg total) by mouth daily. 30 tablet 11  . isosorbide mononitrate (IMDUR) 30 MG 24 hr tablet Take 1 tablet (30 mg total) by mouth daily. 30 tablet 11  . nitroGLYCERIN (NITROSTAT) 0.4 MG SL tablet Place 1 tablet (0.4 mg total) under the tongue every 5 (five) minutes as needed for chest pain. 25 tablet 3  . pantoprazole (PROTONIX) 20 MG tablet Take 20 mg by mouth as needed.     Marland Kitchen. PROAIR HFA 108 (90 BASE) MCG/ACT inhaler Inhale 1 puff into the lungs as needed.     . simvastatin (ZOCOR) 40 MG tablet Take 1 tablet (40 mg total) by mouth at bedtime. 90 tablet 3  . vitamin B-12 (CYANOCOBALAMIN) 500 MCG tablet Take 500 mcg by mouth daily.     No current facility-administered medications for this visit.      Allergies:   Metronidazole   Social History:  The patient  reports that she has been smoking Cigarettes.  She has been smoking about 0.25 packs per day. She has never used smokeless tobacco. She reports that she does not drink alcohol or use drugs.   Family History:   family history includes Diabetes in her mother; Hypertension in her mother; Stroke in her maternal grandfather.    Review of Systems: Review of Systems  Constitutional: Negative.   Respiratory: Positive for shortness of breath.   Cardiovascular: Positive for chest pain.  Gastrointestinal: Negative.   Musculoskeletal: Positive for back pain.  Neurological: Negative.   Psychiatric/Behavioral: Negative.   All other systems reviewed and are negative.    PHYSICAL EXAM: VS:  BP (!) 100/58 (BP Location: Left Arm, Patient Position: Sitting, Cuff  Size: Normal)   Pulse 84   Ht 5' 5.5" (1.664 m)   Wt 155 lb 4 oz (70.4 kg)   LMP  (Approximate) Comment: over 30 years ago, partial hysterectomy  BMI 25.44 kg/m  , BMI Body mass index is 25.44 kg/m. GEN: Well nourished, well developed, in no acute distress  HEENT: normal  Neck: no JVD, carotid bruits, or masses Cardiac: RRR; no murmurs, rubs, or gallops,no edema  Respiratory:  clear to auscultation bilaterally, normal work of breathing GI: soft, nontender, nondistended, + BS MS: no deformity or atrophy  Skin: warm and dry, no rash Neuro:  Strength and sensation are intact Psych: euthymic mood, full affect    Recent Labs: No results found for requested labs within last 8760 hours.    Lipid Panel Lab Results  Component Value Date   CHOL 151 11/25/2013   CHOL 148 11/25/2013   HDL 31 (L) 11/25/2013   HDL 30 (L) 11/25/2013   LDLCALC 73  11/25/2013   LDLCALC 71 11/25/2013   TRIG 234 (H) 11/25/2013   TRIG 236 (H) 11/25/2013      Wt Readings from Last 3 Encounters:  05/28/16 155 lb 4 oz (70.4 kg)  11/22/15 160 lb (72.6 kg)  07/12/15 163 lb (73.9 kg)       ASSESSMENT AND PLAN:  Acute coronary syndrome (HCC) - Plan: EKG 12-Lead Doing well, stable following previous stent LAD and RCA We have refilled her Plavix, she has been out of the Plavix for some time secondary to insurance issues  Coronary artery disease involving native coronary artery of native heart without angina pectoris - Plan: EKG 12-Lead Chronic burning in her chest which she feels is from COPD Reports symptoms are stable, often relieved with taking deep breaths, somewhat better with inhalers  Angina pectoris (HCC) Long discussion concerning her chest pain symptoms. Reports they are stable, no plan at this time for further ischemia workup  Essential hypertension Blood pressure is well controlled on today's visit. No changes made to the medications.   Borderline type 2 diabetes mellitus Congratulated  her on recent weight loss. This will help sugars and cholesterol  Smoker We have encouraged him to continue to work on weaning his cigarettes and smoking cessation. He will continue to work on this and does not want any assistance with chantix.     Total encounter time more than 25 minutes  Greater than 50% was spent in counseling and coordination of care with the patient   Disposition:   F/U  6 months   Orders Placed This Encounter  Procedures  . EKG 12-Lead     Signed, Dossie Arbour, M.D., Ph.D. 05/28/2016  [facility] [place], [place] [phone]

## 2016-07-17 ENCOUNTER — Other Ambulatory Visit: Payer: Self-pay | Admitting: Nurse Practitioner

## 2016-07-17 NOTE — Telephone Encounter (Signed)
Pt calling, needs refill for carvedilol called to [facility]

## 2016-12-17 ENCOUNTER — Encounter: Payer: Self-pay | Admitting: Cardiovascular Disease

## 2016-12-17 ENCOUNTER — Ambulatory Visit (INDEPENDENT_AMBULATORY_CARE_PROVIDER_SITE_OTHER): Payer: Medicare Other | Admitting: Cardiovascular Disease

## 2016-12-17 VITALS — BP 116/70 | HR 69 | Ht 63.0 in | Wt 156.5 lb

## 2016-12-17 DIAGNOSIS — Z955 Presence of coronary angioplasty implant and graft: Secondary | ICD-10-CM

## 2016-12-17 DIAGNOSIS — I213 ST elevation (STEMI) myocardial infarction of unspecified site: Secondary | ICD-10-CM

## 2016-12-17 DIAGNOSIS — I249 Acute ischemic heart disease, unspecified: Secondary | ICD-10-CM

## 2016-12-17 DIAGNOSIS — I214 Non-ST elevation (NSTEMI) myocardial infarction: Secondary | ICD-10-CM | POA: Diagnosis not present

## 2016-12-17 DIAGNOSIS — I2510 Atherosclerotic heart disease of native coronary artery without angina pectoris: Secondary | ICD-10-CM

## 2016-12-17 DIAGNOSIS — F17200 Nicotine dependence, unspecified, uncomplicated: Secondary | ICD-10-CM | POA: Diagnosis not present

## 2016-12-17 DIAGNOSIS — I10 Essential (primary) hypertension: Secondary | ICD-10-CM

## 2016-12-17 NOTE — Progress Notes (Signed)
Cardiology Office Note  Date:  12/17/2016   ID:  Alejandra Hall, DOB 1961/02/19, MRN [id number]  PCP:  Griffith Citron, MD   Chief Complaint  Patient presents with  . other    6 month f/u c/o joint pain. Meds reviewed verbally with pt.    HPI:  Alejandra Hall is a pleasant 56 year old woman with a long history of smoking who continues to smoke, currently unemployed, history of anxiety, history of non-ST elevation MI, transferred to [place] from [facility] with LAD and RCA stent placement 10/2103 She presents for routine followup of her coronary artery disease Stress test 03/2015, had angina sx  Then went for cardiac catheterization 04/2015: LM 10, LAD 30ost, patent stent, D1 50ost, 30 diff, LCX nl, OM2 100 CTO of inferior branch with L-L collats (2.50mm vessel->Med Rx), RCA 10ost ISR.   In the past, chest pain symptoms resolved with inhaler Continues to Smoke 3 -6 cig  Albuterol and advair helps chest pain, unable to afford the inhalers Reports having some chronic back pain, numbness left lower extremity Atypical type shoulder pain, chest pain  No recent lipid panel available  EKG today shows normal sinus rhythm with rate 69 bpm, no significant ST or T-wave changes  Other past medical history Previously seen by GI/anesthesia at [facility]. Colonoscopy was canceled by the patient's report secondary to her chest discomfort. Previous chest pain relieved with albuterol. She reports symptoms are still the same, relieved with one or 2 puffs of Proventil She no longer takes Advair, reporting this made her muscles sore  arthritis in the mornings. teeth pulled prior to her last clinic visit, stopped her effient for several weeks.  Lab work reviewed with her showing total cholesterol 172, LDL 99 in September 2015. Elevated white blood cell count of 21 in October 2015 that returned down to normal December 2015. CRP of 2  She had stuttering pains with worsening pains at the end of February 2015  during a snowstorm. She was driving from [facility] to [place] when she developed chest pain, arm pain, jaw pain. she was given thrombolytics, transferred to [place] where she underwent cardiac catheterization. She had DES placed to her LAD and RCA.  Peak troponin the hospital was for 4.0 on 11/24/2013.    PMH:   has a past medical history of Borderline type 2 diabetes mellitus (11/24/2013); CAD (coronary artery disease); COPD (chronic obstructive pulmonary disease) (HCC); Crohn's disease (HCC) (11/24/2013); Essential hypertension; and Tobacco abuse.  PSH:    Past Surgical History:  Procedure Laterality Date  . APPENDECTOMY    . CARDIAC CATHETERIZATION     s/p inferior STEMI; s/p PCI to LAD and RCA  . CARDIAC CATHETERIZATION N/A 05/10/2015   Procedure: Left Heart Cath and Coronary Angiography;  Surgeon: Antonieta Iba, MD;  Location: [facility] INVASIVE CV LAB;  Service: Cardiovascular;  Laterality: N/A;  . CESAREAN SECTION     X 2  . CORONARY ANGIOPLASTY     s/p inferior STEMI; s/p PCI to LAD and RCA  . FRACTIONAL FLOW RESERVE WIRE Right 11/25/2013   Procedure: FRACTIONAL FLOW RESERVE WIRE;  Surgeon: Corky Crafts, MD;  Location: [facility] CATH LAB;  Service: Cardiovascular;  Laterality: Right;  . LEFT HEART CATHETERIZATION WITH CORONARY ANGIOGRAM N/A 11/25/2013   Procedure: LEFT HEART CATHETERIZATION WITH CORONARY ANGIOGRAM;  Surgeon: Corky Crafts, MD;  Location: [facility] CATH LAB;  Service: Cardiovascular;  Laterality: N/A;  . PARTIAL HYSTERECTOMY    . PERCUTANEOUS CORONARY STENT INTERVENTION (PCI-S)  11/25/2013   Procedure: PERCUTANEOUS CORONARY STENT INTERVENTION (PCI-S);  Surgeon: Corky Crafts, MD;  Location: [facility] CATH LAB;  Service: Cardiovascular;;    Current Outpatient Prescriptions  Medication Sig Dispense Refill  . ADVAIR DISKUS 250-50 MCG/DOSE AEPB Inhale 1 puff into the lungs as needed.     Marland Kitchen aspirin EC 81 MG tablet Take 1 tablet (81 mg total) by mouth daily. 90  tablet 3  . carvedilol (COREG) 3.125 MG tablet TAKE 1/2 TABLET BY MOUTH TWICE DAILY WITH A MEAL 30 tablet 3  . clopidogrel (PLAVIX) 75 MG tablet Take 1 tablet (75 mg total) by mouth daily. 30 tablet 11  . pantoprazole (PROTONIX) 20 MG tablet Take 20 mg by mouth as needed.     Marland Kitchen PROAIR HFA 108 (90 BASE) MCG/ACT inhaler Inhale 1 puff into the lungs as needed.     . simvastatin (ZOCOR) 40 MG tablet Take 1 tablet (40 mg total) by mouth at bedtime. 90 tablet 3  . vitamin B-12 (CYANOCOBALAMIN) 500 MCG tablet Take 500 mcg by mouth daily.     No current facility-administered medications for this visit.      Allergies:   Metronidazole   Social History:  The patient  reports that she has been smoking Cigarettes.  She has been smoking about 0.25 packs per day. She has never used smokeless tobacco. She reports that she drinks alcohol. She reports that she does not use drugs.   Family History:   family history includes Diabetes in her mother; Hypertension in her mother; Stroke in her maternal grandfather.    Review of Systems: Review of Systems  Constitutional: Negative.   Respiratory: Negative.   Cardiovascular: Negative.        Left shoulder pain, chest wall pain  Gastrointestinal: Negative.   Musculoskeletal: Negative.   Neurological: Negative.   Psychiatric/Behavioral: Negative.   All other systems reviewed and are negative.    PHYSICAL EXAM: VS:  BP 116/70 (BP Location: Left Arm, Patient Position: Sitting, Cuff Size: Normal)   Pulse 69   Ht 5\' 3"  (1.6 m)   Wt 156 lb 8 oz (71 kg)   BMI 27.72 kg/m  , BMI Body mass index is 27.72 kg/m. GEN: Well nourished, well developed, in no acute distress  HEENT: normal  Neck: no JVD, carotid bruits, or masses Cardiac: RRR; no murmurs, rubs, or gallops,no edema  Respiratory:  clear to auscultation bilaterally, normal work of breathing GI: soft, nontender, nondistended, + BS MS: no deformity or atrophy  Skin: warm and dry, no rash Neuro:   Strength and sensation are intact Psych: euthymic mood, full affect    Recent Labs: No results found for requested labs within last 8760 hours.    Lipid Panel Lab Results  Component Value Date   CHOL 151 11/25/2013   CHOL 148 11/25/2013   HDL 31 (L) 11/25/2013   HDL 30 (L) 11/25/2013   LDLCALC 73 11/25/2013   LDLCALC 71 11/25/2013   TRIG 234 (H) 11/25/2013   TRIG 236 (H) 11/25/2013      Wt Readings from Last 3 Encounters:  12/17/16 156 lb 8 oz (71 kg)  05/28/16 155 lb 4 oz (70.4 kg)  11/22/15 160 lb (72.6 kg)       ASSESSMENT AND PLAN:  ST elevation myocardial infarction (STEMI), unspecified artery (HCC) - Plan: EKG 12-Lead  Coronary artery disease involving native coronary artery of native heart without angina pectoris - Plan: EKG 12-Lead Currently with no symptoms of angina. No further  workup at this time. Continue current medication regimen.  Essential hypertension - Plan: EKG 12-Lead Blood pressure is well controlled on today's visit. No changes made to the medications.  Smoker - Plan: EKG 12-Lead Still a few cigs per day We have encouraged her to continue to work on weaning her cigarettes and smoking cessation. She will continue to work on this and does not want any assistance with chantix.   History of coronary artery stent placement - Plan: EKG 12-Lead On asa and plavix Still smoking   Total encounter time more than 25 minutes  Greater than 50% was spent in counseling and coordination of care with the patient   Disposition:   F/U  6 months   Orders Placed This Encounter  Procedures  . EKG 12-Lead     Signed, Dossie Arbour, M.D., Ph.D. 12/17/2016  [facility] [place], [place] [phone]

## 2016-12-17 NOTE — Patient Instructions (Addendum)

## 2017-04-06 ENCOUNTER — Telehealth: Payer: Self-pay | Admitting: Cardiovascular Disease

## 2017-04-06 ENCOUNTER — Other Ambulatory Visit: Payer: Self-pay

## 2017-04-06 MED ORDER — CLOPIDOGREL BISULFATE 75 MG PO TABS: 75.0000 mg | ORAL_TABLET | Freq: Every day | ORAL | 3 refills | Status: DC

## 2017-04-06 MED ORDER — SIMVASTATIN 40 MG PO TABS: 40.0000 mg | ORAL_TABLET | Freq: Every day | ORAL | 3 refills | Status: DC

## 2017-04-06 MED ORDER — CARVEDILOL 3.125 MG PO TABS: ORAL_TABLET | ORAL | 3 refills | Status: DC

## 2017-04-06 NOTE — Telephone Encounter (Signed)
Requested Prescriptions   Signed Prescriptions Disp Refills  . carvedilol (COREG) 3.125 MG tablet 30 tablet 3    Sig: TAKE 1/2 TABLET BY MOUTH TWICE DAILY WITH A MEAL    Authorizing Provider: Antonieta Iba    Ordering User: Margrett Rud  . clopidogrel (PLAVIX) 75 MG tablet 30 tablet 3    Sig: Take 1 tablet (75 mg total) by mouth daily.    Authorizing Provider: Antonieta Iba    Ordering User: Margrett Rud simvastatin (ZOCOR) 40 MG tablet 90 tablet 3    Sig: Take 1 tablet (40 mg total) by mouth at bedtime.    Authorizing Provider: Antonieta Iba    Ordering User: Margrett Rud

## 2017-04-06 NOTE — Telephone Encounter (Signed)
°*  STAT* If patient is at the pharmacy, call can be transferred to refill team.   1. Which medications need to be refilled? (please list name of each medication and dose if known)  Simvastatin  Carvedilol  Clopidogrel  2. Which pharmacy/location (including street and city if local pharmacy) is medication to be sent to? Mail in for [facility]  3. Do they need a 30 day or 90 day supply? 90 day

## 2017-06-07 NOTE — Progress Notes (Signed)
Cardiology Office Note  Date:  06/09/2017   ID:  Alejandra Hall, DOB 21-Feb-1961, MRN [id number]  PCP:  Griffith Citron, MD   Chief Complaint  Patient presents with  . OTHER    6 month f/u no complaints today. Meds reviewed verbally with pt.    HPI:  Alejandra Hall is a pleasant 56 year old woman with a long history of  smoking who continues to smoke,  currently unemployed,   anxiety,  non-ST elevation MI,  transferred to [place] from [facility] with LAD and RCA stent placement 10/2103 Stress test 04/2015, had angina sx Cardiac catheterization July 2016, medical management recommended Occluded inferior branch of the OM 3 with collateral filling (likely the reason for her anginal symptoms). Managed by the [facility] clinic She presents for routine followup of her coronary artery disease  In follow-up today she reports she is doing well, no complaints Chest not hurting much, rare Walking more  chest pain symptoms resolved with inhaler Continues to Smoke  <6 cig   Reports having some chronic back pain, numbness left lower extremity  No recent lipid panel available She reports cholesterol is running high, sometimes not taking her simvastatin Numbers done through primary care, not available  EKG personally reviewed by myself on todays visit shows normal sinus rhythm with rate 65 bpm, no significant ST or T-wave changes  Past medical history reviewed cardiac catheterization 04/2015:  Patent stent in the proximal LAD and ostial/proximal RCA Occluded inferior branch of the OM 3 with collateral filling (likely the reason for her anginal symptoms). Case discussed with Dr. Kirke Corin. The inferior branch that is occluded from the proximal to mid region is small in caliber him on likely 2.0 mm or less.   Recommendation was made for medical management given the small size of the vessel, likely chronically occluded, now with collaterals from left to left, some right to left collaterals as well.    Previously seen by GI/anesthesia at [facility]. Colonoscopy was canceled by the patient's report secondary to her chest discomfort. Previous chest pain relieved with albuterol. She reports symptoms are still the same, relieved with one or 2 puffs of Proventil She no longer takes Advair, reporting this made her muscles sore  arthritis in the mornings. teeth pulled prior to her last clinic visit, stopped her effient for several weeks.  Lab work reviewed with her showing total cholesterol 172, LDL 99 in September 2015. Elevated white blood cell count of 21 in October 2015 that returned down to normal December 2015. CRP of 2  She had stuttering pains with worsening pains at the end of February 2015 during a snowstorm. She was driving from [facility] to [place] when she developed chest pain, arm pain, jaw pain. she was given thrombolytics, transferred to [place] where she underwent cardiac catheterization. She had DES placed to her LAD and RCA.  Peak troponin the hospital was for 4.0 on 11/24/2013.    PMH:   has a past medical history of Borderline type 2 diabetes mellitus (11/24/2013); CAD (coronary artery disease); COPD (chronic obstructive pulmonary disease) (HCC); Crohn's disease (HCC) (11/24/2013); Essential hypertension; and Tobacco abuse.  PSH:    Past Surgical History:  Procedure Laterality Date  . APPENDECTOMY    . CARDIAC CATHETERIZATION     s/p inferior STEMI; s/p PCI to LAD and RCA  . CARDIAC CATHETERIZATION N/A 05/10/2015   Procedure: Left Heart Cath and Coronary Angiography;  Surgeon: Antonieta Iba, MD;  Location: [facility] INVASIVE CV LAB;  Service:  Cardiovascular;  Laterality: N/A;  . CESAREAN SECTION     X 2  . CORONARY ANGIOPLASTY     s/p inferior STEMI; s/p PCI to LAD and RCA  . FRACTIONAL FLOW RESERVE WIRE Right 11/25/2013   Procedure: FRACTIONAL FLOW RESERVE WIRE;  Surgeon: Corky Crafts, MD;  Location: [facility] CATH LAB;  Service: Cardiovascular;  Laterality:  Right;  . LEFT HEART CATHETERIZATION WITH CORONARY ANGIOGRAM N/A 11/25/2013   Procedure: LEFT HEART CATHETERIZATION WITH CORONARY ANGIOGRAM;  Surgeon: Corky Crafts, MD;  Location: [facility] CATH LAB;  Service: Cardiovascular;  Laterality: N/A;  . PARTIAL HYSTERECTOMY    . PERCUTANEOUS CORONARY STENT INTERVENTION (PCI-S)  11/25/2013   Procedure: PERCUTANEOUS CORONARY STENT INTERVENTION (PCI-S);  Surgeon: Corky Crafts, MD;  Location: [facility] CATH LAB;  Service: Cardiovascular;;    Current Outpatient Prescriptions  Medication Sig Dispense Refill  . ADVAIR DISKUS 250-50 MCG/DOSE AEPB Inhale 1 puff into the lungs as needed.     Marland Kitchen aspirin EC 81 MG tablet Take 1 tablet (81 mg total) by mouth daily. 90 tablet 3  . carvedilol (COREG) 3.125 MG tablet TAKE 1/2 TABLET BY MOUTH TWICE DAILY WITH A MEAL 30 tablet 3  . clopidogrel (PLAVIX) 75 MG tablet Take 1 tablet (75 mg total) by mouth daily. 30 tablet 3  . pantoprazole (PROTONIX) 20 MG tablet Take 20 mg by mouth as needed.     Marland Kitchen PROAIR HFA 108 (90 BASE) MCG/ACT inhaler Inhale 1 puff into the lungs as needed.     . simvastatin (ZOCOR) 40 MG tablet Take 1 tablet (40 mg total) by mouth at bedtime. 90 tablet 3  . vitamin B-12 (CYANOCOBALAMIN) 500 MCG tablet Take 500 mcg by mouth daily.     No current facility-administered medications for this visit.      Allergies:   Metronidazole   Social History:  The patient  reports that she has been smoking Cigarettes.  She has been smoking about 0.25 packs per day. She has never used smokeless tobacco. She reports that she drinks alcohol. She reports that she does not use drugs.   Family History:   family history includes Diabetes in her mother; Hypertension in her mother; Stroke in her maternal grandfather.    Review of Systems: Review of Systems  Constitutional: Negative.   Respiratory: Negative.   Cardiovascular: Negative.        Rare chest pain  Gastrointestinal: Negative.   Musculoskeletal: Negative.    Neurological: Negative.   Psychiatric/Behavioral: Negative.   All other systems reviewed and are negative.    PHYSICAL EXAM: VS:  BP 120/80 (BP Location: Left Arm, Patient Position: Sitting, Cuff Size: Normal)   Pulse 65   Ht 5\' 3"  (1.6 m)   Wt 154 lb (69.9 kg)   BMI 27.28 kg/m  , BMI Body mass index is 27.28 kg/m. GEN: Well nourished, well developed, in no acute distress  HEENT: normal  Neck: no JVD, carotid bruits, or masses Cardiac: RRR; no murmurs, rubs, or gallops,no edema  Respiratory:  clear to auscultation bilaterally, normal work of breathing GI: soft, nontender, nondistended, + BS MS: no deformity or atrophy  Skin: warm and dry, no rash Neuro:  Strength and sensation are intact Psych: euthymic mood, full affect    Recent Labs: No results found for requested labs within last 8760 hours.    Lipid Panel Lab Results  Component Value Date   CHOL 151 11/25/2013   CHOL 148 11/25/2013   HDL 31 (L) 11/25/2013  HDL 30 (L) 11/25/2013   LDLCALC 73 11/25/2013   LDLCALC 71 11/25/2013   TRIG 234 (H) 11/25/2013   TRIG 236 (H) 11/25/2013      Wt Readings from Last 3 Encounters:  06/09/17 154 lb (69.9 kg)  12/17/16 156 lb 8 oz (71 kg)  05/28/16 155 lb 4 oz (70.4 kg)       ASSESSMENT AND PLAN:   Coronary artery disease involving native coronary artery of native heart without angina pectoris - Plan: EKG 12-Lead Currently with no symptoms of angina. No further workup at this time. Continue current medication regimen.stressed importance of smoking cessation, taking her simvastatin  Essential hypertension - Plan: EKG 12-Lead Blood pressure is well controlled on today's visit. No changes made to the medications.  Smoker - Plan: EKG 12-Lead Half pack per day We have encouraged her to continue to work on weaning her cigarettes and smoking cessation. She will continue to work on this and does not want any assistance with chantix.   History of coronary artery stent  placement - Plan: EKG 12-Lead On asa and plavix Still smoking Stressed importance of taking her simvastatin   Total encounter time more than 25 minutes  Greater than 50% was spent in counseling and coordination of care with the patient   Disposition:   F/U  12 months   Orders Placed This Encounter  Procedures  . EKG 12-Lead     Signed, Dossie Arbour, M.D., Ph.D. 06/09/2017  [facility] [place], [place] [phone]

## 2017-06-09 ENCOUNTER — Ambulatory Visit (INDEPENDENT_AMBULATORY_CARE_PROVIDER_SITE_OTHER): Payer: Medicare Other | Admitting: Cardiovascular Disease

## 2017-06-09 ENCOUNTER — Encounter: Payer: Self-pay | Admitting: Cardiovascular Disease

## 2017-06-09 VITALS — BP 120/80 | HR 65 | Ht 63.0 in | Wt 154.0 lb

## 2017-06-09 DIAGNOSIS — E782 Mixed hyperlipidemia: Secondary | ICD-10-CM | POA: Diagnosis not present

## 2017-06-09 DIAGNOSIS — K5090 Crohn's disease, unspecified, without complications: Secondary | ICD-10-CM

## 2017-06-09 DIAGNOSIS — Z955 Presence of coronary angioplasty implant and graft: Secondary | ICD-10-CM | POA: Diagnosis not present

## 2017-06-09 DIAGNOSIS — Z720 Tobacco use: Secondary | ICD-10-CM | POA: Diagnosis not present

## 2017-06-09 DIAGNOSIS — I10 Essential (primary) hypertension: Secondary | ICD-10-CM | POA: Diagnosis not present

## 2017-06-09 DIAGNOSIS — I25118 Atherosclerotic heart disease of native coronary artery with other forms of angina pectoris: Secondary | ICD-10-CM

## 2017-06-09 MED ORDER — SIMVASTATIN 40 MG PO TABS: 40.0000 mg | ORAL_TABLET | Freq: Every day | ORAL | 3 refills | Status: DC

## 2017-06-09 MED ORDER — CLOPIDOGREL BISULFATE 75 MG PO TABS
75.0000 mg | ORAL_TABLET | Freq: Every day | ORAL | 3 refills | Status: DC
Start: 2017-06-09 — End: 2017-08-12

## 2017-06-09 MED ORDER — CARVEDILOL 3.125 MG PO TABS: ORAL_TABLET | ORAL | 3 refills | Status: DC

## 2017-06-09 NOTE — Patient Instructions (Signed)

## 2017-08-12 ENCOUNTER — Other Ambulatory Visit: Payer: Self-pay | Admitting: *Deleted

## 2017-08-12 MED ORDER — CLOPIDOGREL BISULFATE 75 MG PO TABS: 75.0000 mg | ORAL_TABLET | Freq: Every day | ORAL | 3 refills | Status: DC

## 2019-01-05 ENCOUNTER — Telehealth: Payer: Self-pay

## 2019-01-05 NOTE — Telephone Encounter (Signed)
Called patient from recall list.  LMOV. No answer.  Patient is over due for a 12 month follow up and need to schedule an evisit with patient.

## 2019-01-11 NOTE — Telephone Encounter (Signed)
Patient returning call Patient states she is doing fine and does not want to try and do an evisit at this time

## 2019-01-11 NOTE — Telephone Encounter (Signed)
Called patient from recall list.  LMOV Patient is needing an Evisit for past due 12 month follow up.

## 2019-01-13 ENCOUNTER — Telehealth: Payer: Medicare Other | Admitting: Cardiovascular Disease

## 2019-03-09 ENCOUNTER — Telehealth: Payer: Self-pay

## 2019-03-09 NOTE — Telephone Encounter (Signed)
Patient notified of a virtual visit with NP or PA for this month but she still doesn't agree with a virtual visit at this time. She would prefer being seen in the office only.

## 2020-11-19 ENCOUNTER — Telehealth: Payer: Self-pay | Admitting: Cardiovascular Disease

## 2020-11-19 NOTE — Telephone Encounter (Signed)
3 attempts to schedule fu appt from recall list.   Deleting recall.   

## 2021-12-05 ENCOUNTER — Encounter: Payer: Self-pay | Admitting: Emergency Medicine

## 2021-12-05 ENCOUNTER — Emergency Department
Admission: EM | Admit: 2021-12-05 | Discharge: 2021-12-05 | Disposition: A | Discharge status: Home / Self Care | Payer: Medicare HMO | Attending: Emergency Medicine | Admitting: Emergency Medicine

## 2021-12-05 ENCOUNTER — Other Ambulatory Visit: Payer: Self-pay

## 2021-12-05 DIAGNOSIS — Z79899 Other long term (current) drug therapy: Secondary | ICD-10-CM | POA: Diagnosis not present

## 2021-12-05 DIAGNOSIS — R42 Dizziness and giddiness: Secondary | ICD-10-CM | POA: Insufficient documentation

## 2021-12-05 DIAGNOSIS — R55 Syncope and collapse: Secondary | ICD-10-CM | POA: Insufficient documentation

## 2021-12-05 DIAGNOSIS — Z87891 Personal history of nicotine dependence: Secondary | ICD-10-CM | POA: Diagnosis not present

## 2021-12-05 DIAGNOSIS — D72829 Elevated white blood cell count, unspecified: Secondary | ICD-10-CM | POA: Insufficient documentation

## 2021-12-05 DIAGNOSIS — E119 Type 2 diabetes mellitus without complications: Secondary | ICD-10-CM | POA: Diagnosis not present

## 2021-12-05 LAB — COMPREHENSIVE METABOLIC PANEL
ALT: 11 U/L (ref 0–44)
AST: 14 U/L — ABNORMAL LOW (ref 15–41)
Albumin: 3.7 g/dL (ref 3.5–5.0)
Alkaline Phosphatase: 101 U/L (ref 38–126)
Anion gap: 11 (ref 5–15)
BUN: 11 mg/dL (ref 8–23)
CO2: 22 mmol/L (ref 22–32)
Calcium: 8.3 mg/dL — ABNORMAL LOW (ref 8.9–10.3)
Chloride: 104 mmol/L (ref 98–111)
Creatinine, Ser: 0.92 mg/dL (ref 0.44–1.00)
GFR, Estimated: >60 mL/min (ref >60)
Glucose, Bld: 101 mg/dL — ABNORMAL HIGH (ref 70–99)
Potassium: 3.6 mmol/L (ref 3.5–5.1)
Sodium: 137 mmol/L (ref 135–145)
Total Bilirubin: 0.7 mg/dL (ref 0.3–1.2)
Total Protein: 6.9 g/dL (ref 6.5–8.1)

## 2021-12-05 LAB — CBC WITH DIFFERENTIAL/PLATELET
Abs Immature Granulocytes: 0.08 10*3/uL — ABNORMAL HIGH (ref 0.00–0.07)
Basophils Absolute: 0 10*3/uL (ref 0.0–0.1)
Basophils Relative: 0 %
Eosinophils Absolute: 0.1 10*3/uL (ref 0.0–0.5)
Eosinophils Relative: 1 %
HCT: 38.4 % (ref 36.0–46.0)
Hemoglobin: 12.7 g/dL (ref 12.0–15.0)
Immature Granulocytes: 1 %
Lymphocytes Relative: 11 %
Lymphs Abs: 1.7 10*3/uL (ref 0.7–4.0)
MCH: 29.5 pg (ref 26.0–34.0)
MCHC: 33.1 g/dL (ref 30.0–36.0)
MCV: 89.1 fL (ref 80.0–100.0)
Monocytes Absolute: 1 10*3/uL (ref 0.1–1.0)
Monocytes Relative: 7 %
Neutro Abs: 11.7 10*3/uL — ABNORMAL HIGH (ref 1.7–7.7)
Neutrophils Relative %: 80 %
Platelets: 260 10*3/uL (ref 150–400)
RBC: 4.31 MIL/uL (ref 3.87–5.11)
RDW: 13.9 % (ref 11.5–15.5)
WBC: 14.6 10*3/uL — ABNORMAL HIGH (ref 4.0–10.5)
nRBC: 0 % (ref 0.0–0.2)

## 2021-12-05 LAB — URINALYSIS, ROUTINE W REFLEX MICROSCOPIC
Bacteria, UA: NONE SEEN
Bilirubin Urine: NEGATIVE
Glucose, UA: NEGATIVE mg/dL
Ketones, ur: NEGATIVE mg/dL
Nitrite: NEGATIVE
Protein, ur: NEGATIVE mg/dL
Specific Gravity, Urine: 1.012 (ref 1.005–1.030)
pH: 5 (ref 5.0–8.0)

## 2021-12-05 LAB — URINE DRUG SCREEN, QUALITATIVE (ARMC ONLY)
Amphetamines, Ur Screen: NOT DETECTED
Barbiturates, Ur Screen: NOT DETECTED
Benzodiazepine, Ur Scrn: NOT DETECTED
Cannabinoid 50 Ng, Ur ~~LOC~~: NOT DETECTED
Cocaine Metabolite,Ur ~~LOC~~: NOT DETECTED
MDMA (Ecstasy)Ur Screen: NOT DETECTED
Methadone Scn, Ur: NOT DETECTED
Opiate, Ur Screen: NOT DETECTED
Phencyclidine (PCP) Ur S: NOT DETECTED
Tricyclic, Ur Screen: NOT DETECTED

## 2021-12-05 LAB — TROPONIN I (HIGH SENSITIVITY): Troponin I (High Sensitivity): <2 ng/L (ref <18)

## 2021-12-05 NOTE — ED Provider Notes (Signed)
? ?[facility] ?Provider Note ? ? Event Date/Time  ? First MD Initiated Contact with Patient 12/05/21 1522   ?  (approximate) ?History  ?Weakness and Dizziness ? ?HPI ?Alejandra Hall is a 61 y.o. female with stated history of type 2 diabetes, Crohn's disease, PCOS, current tobacco abuse who presents for episode of weakness.  Patient states that she was at work when she began feeling like she needed to lay down to not being able to focus on homework.  She did go outside to her car and taking a nap she awoke and EMS had been called encouraged her to come to the emergency department.  Patient states that she has no further symptoms from this episode and feels back to her baseline.  Patient is also concerned as she has been around her grandson who was recently seen today for opiate overdose and is concerned that she may have opiates in her system as well.  Patient denies any preceding symptoms of chest pain, shortness of breath, diaphoresis, nausea/vomiting/diarrhea, weakness/numbness/paresthesias ?Physical Exam  ?Triage Vital Signs: ?ED Triage Vitals  ?Enc Vitals Group  ?   BP   ?   Pulse   ?   Resp   ?   Temp   ?   Temp src   ?   SpO2   ?   Weight   ?   Height   ?   Head Circumference   ?   Peak Flow   ?   Pain Score   ?   Pain Loc   ?   Pain Edu?   ?   Excl. in [place]?   ? ?Most recent vital signs: ?Vitals:  ? 12/05/21 1537  ?BP: 126/76  ?Pulse: 84  ?Resp: 19  ?Temp: 99.1 ?F (37.3 ?C)  ?SpO2: 98%  ? ?General: Awake, oriented x4. ?CV:  Good peripheral perfusion.  ?Resp:  Normal effort.  ?Abd:  No distention.  ?Other:  Middle-aged overweight Caucasian female laying in bed in no distress.  Moving all extremities spontaneously, clear speech ?ED Results / Procedures / Treatments  ?Labs ?(all labs ordered are listed, but only abnormal results are displayed) ?Labs Reviewed  ?COMPREHENSIVE METABOLIC PANEL - Abnormal; Notable for the following components:  ?    Result Value  ? Glucose, Bld 101 (*)   ?  Calcium 8.3 (*)   ? AST 14 (*)   ? All other components within normal limits  ?CBC WITH DIFFERENTIAL/PLATELET - Abnormal; Notable for the following components:  ? WBC 14.6 (*)   ? Neutro Abs 11.7 (*)   ? Abs Immature Granulocytes 0.08 (*)   ? All other components within normal limits  ?URINALYSIS, ROUTINE W REFLEX MICROSCOPIC - Abnormal; Notable for the following components:  ? Color, Urine YELLOW (*)   ? APPearance CLOUDY (*)   ? Hgb urine dipstick SMALL (*)   ? Leukocytes,Ua LARGE (*)   ? All other components within normal limits  ?URINE DRUG SCREEN, QUALITATIVE ([facility] ONLY)  ?TROPONIN I (HIGH SENSITIVITY)  ?TROPONIN I (HIGH SENSITIVITY)  ? ?EKG ?ED ECG REPORT ?I, Naaman Plummer, the attending physician, personally viewed and interpreted this ECG. ?Date: 12/05/2021 ?EKG Time: 1532 ?Rate: 78 ?Rhythm: normal sinus rhythm ?QRS Axis: normal ?Intervals: normal ?ST/T Wave abnormalities: normal ?Narrative Interpretation: no evidence of acute ischemia ? ?PROCEDURES: ?Critical Care performed: No ?.1-3 Lead EKG Interpretation ?Performed by: Naaman Plummer, MD ?Authorized by: Naaman Plummer, MD  ? ?  Interpretation: normal   ?  ECG rate:  83 ?  ECG rate assessment: normal   ?  Rhythm: sinus rhythm   ?  Ectopy: none   ?  Conduction: normal   ?MEDICATIONS ORDERED IN ED: ?Medications - No data to display ?IMPRESSION / MDM / ASSESSMENT AND PLAN / ED COURSE  ?I reviewed the triage vital signs and the nursing notes. ?             ?               ?The patient is on the cardiac monitor to evaluate for evidence of arrhythmia and/or significant heart rate changes. ?Patient presents with complaints of syncope/presyncope ?ED Workup:  ?CBC, BMP, Troponin, BNP, ECG ?Differential diagnosis includes HF, ICH, seizure, stroke, HOCM, ACS, aortic dissection, malignant arrhythmia, or GI bleed. ?Findings: ?No evidence of acute laboratory abnormalities other than incidentally found mild leukocytosis of 14 likely due to patient's corticosteroid  for her Crohn's disease.  Troponin negative x1 ?EKG: No e/o STEMI. No evidence of Brugada?s sign, delta wave, epsilon wave, significantly prolonged QTc, or malignant arrhythmia. ? ?Disposition: Discharge. Patient is at baseline at this time. Return precautions expressed and understood in person. Advised follow up with primary care provider or clinic physician in next 24 hours. ? ?  ?FINAL CLINICAL IMPRESSION(S) / ED DIAGNOSES  ? ?Final diagnoses:  ?Near syncope  ?Lightheadedness  ? ?Rx / DC Orders  ? ?ED Discharge Orders   ? ? None  ? ?  ? ?Note:  This document was prepared using Dragon voice recognition software and may include unintentional dictation errors. ?  ?Naaman Plummer, MD ?12/05/21 1802 ? ?

## 2021-12-05 NOTE — ED Triage Notes (Signed)
Pt to ED via [facility] from work c/o weakness and dizziness. Pt states she had an episode of weakness and confusion, went out to her car to " take a breather", pt states that she fell asleep for about an hour. Per EMS pt was found in car "asleep" and lethargic. Pt states she has recently had family that has been around drugs, and would like to receive testing to make sure nothing is in her system.  ?Pt is A&ox4, calm and cooperative ? ?Pt has hx of 3 cardiac stents and COPD ? ?EMS 20G R AC ?CBG 116 ?146/87 ? ?

## 2022-09-16 ENCOUNTER — Ambulatory Visit: Payer: Medicare HMO | Attending: Cardiovascular Disease | Admitting: Cardiovascular Disease

## 2022-09-16 ENCOUNTER — Other Ambulatory Visit
Admission: RE | Admit: 2022-09-16 | Discharge: 2022-09-16 | Disposition: A | Discharge status: Home / Self Care | Payer: Medicare HMO | Source: Ambulatory Visit | Attending: Cardiovascular Disease | Admitting: Cardiovascular Disease

## 2022-09-16 ENCOUNTER — Encounter: Payer: Self-pay | Admitting: Cardiovascular Disease

## 2022-09-16 ENCOUNTER — Other Ambulatory Visit: Payer: Self-pay | Admitting: Cardiovascular Disease

## 2022-09-16 VITALS — BP 100/60 | HR 78 | Ht 64.0 in | Wt 144.4 lb

## 2022-09-16 DIAGNOSIS — I200 Unstable angina: Secondary | ICD-10-CM

## 2022-09-16 DIAGNOSIS — I25118 Atherosclerotic heart disease of native coronary artery with other forms of angina pectoris: Secondary | ICD-10-CM | POA: Insufficient documentation

## 2022-09-16 DIAGNOSIS — I10 Essential (primary) hypertension: Secondary | ICD-10-CM

## 2022-09-16 DIAGNOSIS — F17200 Nicotine dependence, unspecified, uncomplicated: Secondary | ICD-10-CM | POA: Insufficient documentation

## 2022-09-16 DIAGNOSIS — R7303 Prediabetes: Secondary | ICD-10-CM

## 2022-09-16 DIAGNOSIS — E782 Mixed hyperlipidemia: Secondary | ICD-10-CM | POA: Insufficient documentation

## 2022-09-16 LAB — CBC
HCT: 36.7 % (ref 36.0–46.0)
Hemoglobin: 11.9 g/dL — ABNORMAL LOW (ref 12.0–15.0)
MCH: 29.4 pg (ref 26.0–34.0)
MCHC: 32.4 g/dL (ref 30.0–36.0)
MCV: 90.6 fL (ref 80.0–100.0)
Platelets: 287 10*3/uL (ref 150–400)
RBC: 4.05 MIL/uL (ref 3.87–5.11)
RDW: 13.9 % (ref 11.5–15.5)
WBC: 10 10*3/uL (ref 4.0–10.5)
nRBC: 0 % (ref 0.0–0.2)

## 2022-09-16 LAB — BASIC METABOLIC PANEL
Anion gap: 8 (ref 5–15)
BUN: 16 mg/dL (ref 8–23)
CO2: 25 mmol/L (ref 22–32)
Calcium: 8.8 mg/dL — ABNORMAL LOW (ref 8.9–10.3)
Chloride: 107 mmol/L (ref 98–111)
Creatinine, Ser: 0.66 mg/dL (ref 0.44–1.00)
GFR, Estimated: >60 mL/min (ref >60)
Glucose, Bld: 90 mg/dL (ref 70–99)
Potassium: 3.6 mmol/L (ref 3.5–5.1)
Sodium: 140 mmol/L (ref 135–145)

## 2022-09-16 MED ORDER — CARVEDILOL 3.125 MG PO TABS: ORAL_TABLET | ORAL | 3 refills | Status: DC

## 2022-09-16 MED ORDER — EZETIMIBE 10 MG PO TABS: 10.0000 mg | ORAL_TABLET | Freq: Every day | ORAL | 3 refills | Status: DC

## 2022-09-16 MED ORDER — CLOPIDOGREL BISULFATE 75 MG PO TABS: 75.0000 mg | ORAL_TABLET | Freq: Every day | ORAL | 3 refills | Status: DC

## 2022-09-16 MED ORDER — SIMVASTATIN 40 MG PO TABS: 40.0000 mg | ORAL_TABLET | Freq: Every day | ORAL | 3 refills | Status: DC

## 2022-09-16 MED ORDER — SODIUM CHLORIDE 0.9% FLUSH: 3.0000 mL | Freq: Two times a day (BID) | INTRAVENOUS | Status: DC

## 2022-09-16 NOTE — H&P (View-Only) (Signed)
Cardiology Office Note  Date:  09/16/2022   ID:  Alejandra Hall, DOB Mar 13, 1961, MRN [id number]  PCP:  [facility]   Chief Complaint  Patient presents with   New Patient (Initial Visit)    Establish care for CAD. Patient c/o dizziness, pain in left arm that radiates into neck & into chest with exertion. Medications reviewed by the patient verbally.     HPI:  [name] is a pleasant 61 year old woman with a long history of  smoking who continues to smoke,  currently unemployed,   anxiety,  CAD, non-ST elevation MI,  transferred to [place] from [facility] with LAD and RCA stent placement 10/2013 EF at that time 40 to 45% Stress test 04/2015, had angina sx, EF 53% Cardiac catheterization July 2016, medical management recommended Occluded inferior branch of the OM 3 with collateral filling (likely the reason for her anginal symptoms). Managed by the [facility] She presents for routine followup of her coronary artery disease  Last seen by myself in clinic in 2018 Followed at the [facility] seen in the emergency room March 2023 Presenting with weakness, lightheadedness, workup negative  In follow-up today reports having worsening anginal symptoms over the past month Reports developing chest pain with walking, "doing something" will bring on symptoms Sx in chest, arm, into neck, reports symptoms are similar to prior anginal symptoms prior to stenting Putting cheyenne pepper into coffee to make sx get better, not really working Also reports having episodes of diaphoresis, goes away without intervention  Some dizziness, blood pressure low on today's visit Trying to stay hydrated  Continues to smoke, trying to quit Cholesterol above goal Lab work from [place] [facility] total cholesterol 168 LDL 90 on simvastatin 40 daily   EKG personally reviewed by myself on todays visit shows normal sinus rhythm with rate 78 bpm, no significant ST or T-wave changes  Past  medical history reviewed cardiac catheterization 04/2015:  Patent stent in the proximal LAD and ostial/proximal RCA Occluded inferior branch of the OM 3 with collateral filling (likely the reason for her anginal symptoms). The inferior branch that is occluded from the proximal to mid region is small in caliber him on likely 2.0 mm or less.   Recommendation was made for medical management given the small size of the vessel, likely chronically occluded, now with collaterals from left to left, some right to left collaterals as well.    Previously seen by GI/anesthesia at [facility]. Colonoscopy was canceled by the patient's report secondary to her chest discomfort. Previous chest pain relieved with albuterol.    arthritis in the mornings.  teeth pulled prior to her last clinic visit, stopped her effient for several weeks.   Lab work reviewed with her showing total cholesterol  172, LDL 99 in September 2015. Elevated white blood cell count of 21 in October 2015 that returned down to normal December 2015. CRP of 2   She had stuttering pains with worsening pains at the end of February 2015 during a snowstorm. She was driving from [facility] to [place] when she developed chest pain, arm pain, jaw pain. she was given thrombolytics, transferred to [place] where she underwent cardiac catheterization. She had DES placed to her LAD and RCA.  Peak troponin the hospital was for 4.0 on 11/24/2013.      PMH:   has a past medical history of Borderline type 2 diabetes mellitus (11/24/2013), CAD (coronary artery disease), COPD (chronic obstructive pulmonary disease) (HCC), Crohn's disease (HCC) (  11/24/2013), Essential hypertension, and Tobacco abuse.  PSH:    Past Surgical History:  Procedure Laterality Date   APPENDECTOMY     CARDIAC CATHETERIZATION     s/p inferior STEMI; s/p PCI to LAD and RCA   CARDIAC CATHETERIZATION N/A 05/10/2015   Procedure: Left Heart Cath and Coronary Angiography;  Surgeon:  Kyndell Zeiser J Jovi Zavadil, MD;  Location: [facility] INVASIVE CV LAB;  Service: Cardiovascular;  Laterality: N/A;   CESAREAN SECTION     X 2   CORONARY ANGIOPLASTY     s/p inferior STEMI; s/p PCI to LAD and RCA   FRACTIONAL FLOW RESERVE WIRE Right 11/25/2013   Procedure: FRACTIONAL FLOW RESERVE WIRE;  Surgeon: Jayadeep S Varanasi, MD;  Location: [facility] CATH LAB;  Service: Cardiovascular;  Laterality: Right;   LEFT HEART CATHETERIZATION WITH CORONARY ANGIOGRAM N/A 11/25/2013   Procedure: LEFT HEART CATHETERIZATION WITH CORONARY ANGIOGRAM;  Surgeon: Jayadeep S Varanasi, MD;  Location: [facility] CATH LAB;  Service: Cardiovascular;  Laterality: N/A;   PARTIAL HYSTERECTOMY     PERCUTANEOUS CORONARY STENT INTERVENTION (PCI-S)  11/25/2013   Procedure: PERCUTANEOUS CORONARY STENT INTERVENTION (PCI-S);  Surgeon: Jayadeep S Varanasi, MD;  Location: [facility] CATH LAB;  Service: Cardiovascular;;    Current Outpatient Medications  Medication Sig Dispense Refill   ADVAIR DISKUS 250-50 MCG/DOSE AEPB Inhale 1 puff into the lungs as needed.      aspirin EC 81 MG tablet Take 1 tablet (81 mg total) by mouth daily. 90 tablet 3   ezetimibe (ZETIA) 10 MG tablet Take 1 tablet (10 mg total) by mouth daily. 90 tablet 3   pantoprazole (PROTONIX) 20 MG tablet Take 20 mg by mouth as needed.     vitamin B-12 (CYANOCOBALAMIN) 500 MCG tablet Take 500 mcg by mouth daily.     carvedilol (COREG) 3.125 MG tablet TAKE 1/2 TABLET BY MOUTH TWICE DAILY WITH A MEAL 90 tablet 3   clopidogrel (PLAVIX) 75 MG tablet Take 1 tablet (75 mg total) by mouth daily. 90 tablet 3   nitroGLYCERIN (NITROSTAT) 0.4 MG SL tablet Place under the tongue. (Patient not taking: Reported on 09/16/2022)     PROAIR HFA 108 (90 BASE) MCG/ACT inhaler Inhale 1 puff into the lungs as needed.  (Patient not taking: Reported on 09/16/2022)     simvastatin (ZOCOR) 40 MG tablet Take 1 tablet (40 mg total) by mouth at bedtime. 90 tablet 3   No current facility-administered medications for this  visit.     Allergies:   Metronidazole   Social History:  The patient  reports that she has been smoking cigarettes. She has been smoking an average of .25 packs per day. She has never used smokeless tobacco. She reports current alcohol use. She reports that she does not use drugs.   Family History:   family history includes Diabetes in her mother; Hypertension in her mother; Stroke in her maternal grandfather.    Review of Systems: Review of Systems  Constitutional: Negative.   HENT: Negative.    Respiratory: Negative.    Cardiovascular:  Positive for chest pain.  Gastrointestinal: Negative.   Musculoskeletal: Negative.   Neurological: Negative.   Psychiatric/Behavioral: Negative.    All other systems reviewed and are negative.    PHYSICAL EXAM: VS:  BP 100/60 (BP Location: Left Arm, Patient Position: Sitting, Cuff Size: Normal)   Pulse 78   Ht 5' 4" (1.626 m)   Wt 144 lb 6 oz (65.5 kg)   SpO2 98%   BMI 24.78 kg/m  ,   BMI Body mass index is 24.78 kg/m. Constitutional:  oriented to person, place, and time. No distress.  HENT:  Head: Grossly normal Eyes:  no discharge. No scleral icterus.  Neck: No JVD, no carotid bruits  Cardiovascular: Regular rate and rhythm, no murmurs appreciated Pulmonary/Chest: Clear to auscultation bilaterally, no wheezes or rails Abdominal: Soft.  no distension.  no tenderness.  Musculoskeletal: Normal range of motion Neurological:  normal muscle tone. Coordination normal. No atrophy Skin: Skin warm and dry Psychiatric: normal affect, pleasant   Recent Labs: 12/05/2021: ALT 11 09/16/2022: BUN 16; Creatinine, Ser 0.66; Hemoglobin 11.9; Platelets 287; Potassium 3.6; Sodium 140    Lipid Panel Lab Results  Component Value Date   CHOL 151 11/25/2013   CHOL 148 11/25/2013   HDL 31 (L) 11/25/2013   HDL 30 (L) 11/25/2013   LDLCALC 73 11/25/2013   LDLCALC 71 11/25/2013   TRIG 234 (H) 11/25/2013   TRIG 236 (H) 11/25/2013      Wt Readings  from Last 3 Encounters:  09/16/22 144 lb 6 oz (65.5 kg)  12/05/21 150 lb (68 kg)  06/09/17 154 lb (69.9 kg)      ASSESSMENT AND PLAN:   Coronary artery disease involving native coronary artery of native heart without angina pectoris - Worsening anginal symptoms Risk factors include hyperlipidemia, continued smoking Long discussion with her concerning various treatment options, given the severity of her symptoms, symptoms consistent with prior anginal symptoms prior to stent placement, we will schedule her for cardiac catheterization Lab work scheduled today, catheterization scheduled on December 22 as she has the day off from work  Essential hypertension -  Seen in the emergency room for dizziness March 2023 Blood pressure running low today, [id number] systolic Orthostatic mildly positive, recommend she stay positive and if blood pressure continues to run low may need to stop the carvedilol  Smoker -  We have encouraged her to continue to work on weaning her cigarettes and smoking cessation. She will continue to work on this and does not want any assistance with chantix.    History of coronary artery stent placement  On asa and plavix Still smoking, cessation recommended Continue simvastatin, we will add Zetia Catheterization tentatively scheduled this week   Total encounter time more than 60 minutes  Greater than 50% was spent in counseling and coordination of care with the patient   Orders Placed This Encounter  Procedures   Basic metabolic panel   CBC   EKG 12-Lead     Signed, Esmond Plants, M.D., Ph.D. 09/16/2022  [place], [place]

## 2022-09-16 NOTE — Patient Instructions (Addendum)
Cardiac cath left heart for angina, known CAD, prior stents x 2 Dec 22nd  Medication Instructions:  Your physician has recommended you make the following change in your medication:  Please start zetia 10 mg daily.  If you need a refill on your cardiac medications before your next appointment, please call your pharmacy.   Lab work:  Your physician recommends that you have lab work today at the [facility] today.  CBC/BMP  Testing/Procedures:   [facility] HEARTCARE A DEPT OF [place]. [name]. [facility] [address], [address] [place] [address] Dept: [phone] Loc: [phone]  Alejandra Hall  09/16/2022  You are scheduled for a Cardiac Catheterization on Friday, December 22 with Dr. Lorine Bears.  1. Please arrive at Registration at [facility] at  7:30 AM . Free valet parking service is available.   Special note: Every effort is made to have your procedure done on time. Please understand that emergencies sometimes delay scheduled procedures.  2. Diet: Do not eat solid foods after midnight.  The patient may have clear liquids until 5am upon the day of the procedure.  3. Labs: You will need to have blood drawn on Tuesday, December 19 at [facility] Entrance, Go to 1st desk on your right to register.                                                                                            Address: [address] [place], [place] [address]  Open: 8am - 5pm  Phone: [phone]. You do not need to be fasting.   On the morning of your procedure, take your Aspirin 81 mg and Plavix/Clopidogrel and any morning medicines NOT listed above.  You may use sips of water.  5. Plan for one night stay--bring personal belongings. 6. Bring a current list of your medications and current insurance cards. 7. You MUST have a responsible person to drive you home. 8. Someone MUST be  with you the first 24 hours after you arrive home or your discharge will be delayed. 9. Please wear clothes that are easy to get on and off and wear slip-on shoes.  Thank you for allowing [place] to care for you!   -- [place] [facility]    Follow-Up: At [facility], you and your health needs are our priority.  As part of our continuing mission to provide you with exceptional heart care, we have created designated Provider Care Teams.  These Care Teams include your primary Cardiologist (physician) and Advanced Practice Providers (APPs -  Physician Assistants and Nurse Practitioners) who all work together to provide you with the care you need, when you need it.  You will need a follow up appointment in 2 months  Providers on your designated Care Team:   Nicolasa Ducking, NP Eula Listen, PA-C Cadence Fransico [place], [place]  COVID-19 Vaccine Information can be found at: [email or web address] For questions related to vaccine distribution or appointments, please email [email or web address] or call [phone].

## 2022-09-16 NOTE — Progress Notes (Signed)
Cardiology Office Note  Date:  09/16/2022   ID:  Alejandra Hall, DOB Mar 13, 1961, MRN [id number]  PCP:  [facility]   Chief Complaint  Patient presents with   New Patient (Initial Visit)    Establish care for CAD. Patient c/o dizziness, pain in left arm that radiates into neck & into chest with exertion. Medications reviewed by the patient verbally.     HPI:  Alejandra Hall is a pleasant 61 year old woman with a long history of  smoking who continues to smoke,  currently unemployed,   anxiety,  CAD, non-ST elevation MI,  transferred to [place] from [facility] with LAD and RCA stent placement 10/2013 EF at that time 40 to 45% Stress test 04/2015, had angina sx, EF 53% Cardiac catheterization July 2016, medical management recommended Occluded inferior branch of the OM 3 with collateral filling (likely the reason for her anginal symptoms). Managed by the [facility] She presents for routine followup of her coronary artery disease  Last seen by myself in clinic in 2018 Followed at the [facility] seen in the emergency room March 2023 Presenting with weakness, lightheadedness, workup negative  In follow-up today reports having worsening anginal symptoms over the past month Reports developing chest pain with walking, "doing something" will bring on symptoms Sx in chest, arm, into neck, reports symptoms are similar to prior anginal symptoms prior to stenting Putting cheyenne pepper into coffee to make sx get better, not really working Also reports having episodes of diaphoresis, goes away without intervention  Some dizziness, blood pressure low on today's visit Trying to stay hydrated  Continues to smoke, trying to quit Cholesterol above goal Lab work from [place] total cholesterol 168 LDL 90 on simvastatin 40 daily   EKG personally reviewed by myself on todays visit shows normal sinus rhythm with rate 78 bpm, no significant ST or T-wave changes  Past  medical history reviewed cardiac catheterization 04/2015:  Patent stent in the proximal LAD and ostial/proximal RCA Occluded inferior branch of the OM 3 with collateral filling (likely the reason for her anginal symptoms). The inferior branch that is occluded from the proximal to mid region is small in caliber him on likely 2.0 mm or less.   Recommendation was made for medical management given the small size of the vessel, likely chronically occluded, now with collaterals from left to left, some right to left collaterals as well.    Previously seen by GI/anesthesia at [facility]. Colonoscopy was canceled by the patient's report secondary to her chest discomfort. Previous chest pain relieved with albuterol.    arthritis in the mornings.  teeth pulled prior to her last clinic visit, stopped her effient for several weeks.   Lab work reviewed with her showing total cholesterol  172, LDL 99 in September 2015. Elevated white blood cell count of 21 in October 2015 that returned down to normal December 2015. CRP of 2   She had stuttering pains with worsening pains at the end of February 2015 during a snowstorm. She was driving from [facility] to [place] when she developed chest pain, arm pain, jaw pain. she was given thrombolytics, transferred to [place] where she underwent cardiac catheterization. She had DES placed to her LAD and RCA.  Peak troponin the hospital was for 4.0 on 11/24/2013.      PMH:   has a past medical history of Borderline type 2 diabetes mellitus (11/24/2013), CAD (coronary artery disease), COPD (chronic obstructive pulmonary disease) (HCC), Crohn's disease (HCC) (  11/24/2013), Essential hypertension, and Tobacco abuse.  PSH:    Past Surgical History:  Procedure Laterality Date   APPENDECTOMY     CARDIAC CATHETERIZATION     s/p inferior STEMI; s/p PCI to LAD and RCA   CARDIAC CATHETERIZATION N/A 05/10/2015   Procedure: Left Heart Cath and Coronary Angiography;  Surgeon:  Minna Merritts, MD;  Location: [place] CV LAB;  Service: Cardiovascular;  Laterality: N/A;   CESAREAN SECTION     X 2   CORONARY ANGIOPLASTY     s/p inferior STEMI; s/p PCI to LAD and RCA   FRACTIONAL FLOW RESERVE WIRE Right 11/25/2013   Procedure: FRACTIONAL FLOW RESERVE WIRE;  Surgeon: Jettie Booze, MD;  Location: [facility] CATH LAB;  Service: Cardiovascular;  Laterality: Right;   LEFT HEART CATHETERIZATION WITH CORONARY ANGIOGRAM N/A 11/25/2013   Procedure: LEFT HEART CATHETERIZATION WITH CORONARY ANGIOGRAM;  Surgeon: Jettie Booze, MD;  Location: [facility] CATH LAB;  Service: Cardiovascular;  Laterality: N/A;   PARTIAL HYSTERECTOMY     PERCUTANEOUS CORONARY STENT INTERVENTION (PCI-S)  11/25/2013   Procedure: PERCUTANEOUS CORONARY STENT INTERVENTION (PCI-S);  Surgeon: Jettie Booze, MD;  Location: [facility] CATH LAB;  Service: Cardiovascular;;    Current Outpatient Medications  Medication Sig Dispense Refill   ADVAIR DISKUS 250-50 MCG/DOSE AEPB Inhale 1 puff into the lungs as needed.      aspirin EC 81 MG tablet Take 1 tablet (81 mg total) by mouth daily. 90 tablet 3   ezetimibe (ZETIA) 10 MG tablet Take 1 tablet (10 mg total) by mouth daily. 90 tablet 3   pantoprazole (PROTONIX) 20 MG tablet Take 20 mg by mouth as needed.     vitamin B-12 (CYANOCOBALAMIN) 500 MCG tablet Take 500 mcg by mouth daily.     carvedilol (COREG) 3.125 MG tablet TAKE 1/2 TABLET BY MOUTH TWICE DAILY WITH A MEAL 90 tablet 3   clopidogrel (PLAVIX) 75 MG tablet Take 1 tablet (75 mg total) by mouth daily. 90 tablet 3   nitroGLYCERIN (NITROSTAT) 0.4 MG SL tablet Place under the tongue. (Patient not taking: Reported on 09/16/2022)     PROAIR HFA 108 (90 BASE) MCG/ACT inhaler Inhale 1 puff into the lungs as needed.  (Patient not taking: Reported on 09/16/2022)     simvastatin (ZOCOR) 40 MG tablet Take 1 tablet (40 mg total) by mouth at bedtime. 90 tablet 3   No current facility-administered medications for this  visit.     Allergies:   Metronidazole   Social History:  The patient  reports that she has been smoking cigarettes. She has been smoking an average of .25 packs per day. She has never used smokeless tobacco. She reports current alcohol use. She reports that she does not use drugs.   Family History:   family history includes Diabetes in her mother; Hypertension in her mother; Stroke in her maternal grandfather.    Review of Systems: Review of Systems  Constitutional: Negative.   HENT: Negative.    Respiratory: Negative.    Cardiovascular:  Positive for chest pain.  Gastrointestinal: Negative.   Musculoskeletal: Negative.   Neurological: Negative.   Psychiatric/Behavioral: Negative.    All other systems reviewed and are negative.    PHYSICAL EXAM: VS:  BP 100/60 (BP Location: Left Arm, Patient Position: Sitting, Cuff Size: Normal)   Pulse 78   Ht 5\' 4"  (1.626 m)   Wt 144 lb 6 oz (65.5 kg)   SpO2 98%   BMI 24.78 kg/m  ,  BMI Body mass index is 24.78 kg/m. Constitutional:  oriented to person, place, and time. No distress.  HENT:  Head: Grossly normal Eyes:  no discharge. No scleral icterus.  Neck: No JVD, no carotid bruits  Cardiovascular: Regular rate and rhythm, no murmurs appreciated Pulmonary/Chest: Clear to auscultation bilaterally, no wheezes or rails Abdominal: Soft.  no distension.  no tenderness.  Musculoskeletal: Normal range of motion Neurological:  normal muscle tone. Coordination normal. No atrophy Skin: Skin warm and dry Psychiatric: normal affect, pleasant   Recent Labs: 12/05/2021: ALT 11 09/16/2022: BUN 16; Creatinine, Ser 0.66; Hemoglobin 11.9; Platelets 287; Potassium 3.6; Sodium 140    Lipid Panel Lab Results  Component Value Date   CHOL 151 11/25/2013   CHOL 148 11/25/2013   HDL 31 (L) 11/25/2013   HDL 30 (L) 11/25/2013   LDLCALC 73 11/25/2013   LDLCALC 71 11/25/2013   TRIG 234 (H) 11/25/2013   TRIG 236 (H) 11/25/2013      Wt Readings  from Last 3 Encounters:  09/16/22 144 lb 6 oz (65.5 kg)  12/05/21 150 lb (68 kg)  06/09/17 154 lb (69.9 kg)      ASSESSMENT AND PLAN:   Coronary artery disease involving native coronary artery of native heart without angina pectoris - Worsening anginal symptoms Risk factors include hyperlipidemia, continued smoking Long discussion with her concerning various treatment options, given the severity of her symptoms, symptoms consistent with prior anginal symptoms prior to stent placement, we will schedule her for cardiac catheterization Lab work scheduled today, catheterization scheduled on December 22 as she has the day off from work  Essential hypertension -  Seen in the emergency room for dizziness March 2023 Blood pressure running low today, [id number] systolic Orthostatic mildly positive, recommend she stay positive and if blood pressure continues to run low may need to stop the carvedilol  Smoker -  We have encouraged her to continue to work on weaning her cigarettes and smoking cessation. She will continue to work on this and does not want any assistance with chantix.    History of coronary artery stent placement  On asa and plavix Still smoking, cessation recommended Continue simvastatin, we will add Zetia Catheterization tentatively scheduled this week   Total encounter time more than 60 minutes  Greater than 50% was spent in counseling and coordination of care with the patient   Orders Placed This Encounter  Procedures   Basic metabolic panel   CBC   EKG 12-Lead     Signed, Esmond Plants, M.D., Ph.D. 09/16/2022  [place], [place]

## 2022-09-19 ENCOUNTER — Ambulatory Visit
Admission: RE | Admit: 2022-09-19 | Discharge: 2022-09-19 | Disposition: A | Discharge status: Home / Self Care | Payer: Medicare HMO | Source: Ambulatory Visit | Attending: Cardiovascular Disease | Admitting: Cardiovascular Disease

## 2022-09-19 ENCOUNTER — Encounter: Payer: Self-pay | Admitting: Cardiovascular Disease

## 2022-09-19 ENCOUNTER — Encounter
Admission: RE | Disposition: A | Discharge status: Home / Self Care | Payer: Self-pay | Source: Ambulatory Visit | Attending: Cardiovascular Disease

## 2022-09-19 ENCOUNTER — Other Ambulatory Visit: Payer: Self-pay

## 2022-09-19 DIAGNOSIS — I200 Unstable angina: Secondary | ICD-10-CM | POA: Diagnosis present

## 2022-09-19 DIAGNOSIS — Z955 Presence of coronary angioplasty implant and graft: Secondary | ICD-10-CM | POA: Diagnosis not present

## 2022-09-19 DIAGNOSIS — E785 Hyperlipidemia, unspecified: Secondary | ICD-10-CM | POA: Insufficient documentation

## 2022-09-19 DIAGNOSIS — F419 Anxiety disorder, unspecified: Secondary | ICD-10-CM | POA: Insufficient documentation

## 2022-09-19 DIAGNOSIS — I10 Essential (primary) hypertension: Secondary | ICD-10-CM | POA: Insufficient documentation

## 2022-09-19 DIAGNOSIS — Z7902 Long term (current) use of antithrombotics/antiplatelets: Secondary | ICD-10-CM | POA: Diagnosis not present

## 2022-09-19 DIAGNOSIS — Z7982 Long term (current) use of aspirin: Secondary | ICD-10-CM | POA: Insufficient documentation

## 2022-09-19 DIAGNOSIS — Z79899 Other long term (current) drug therapy: Secondary | ICD-10-CM | POA: Diagnosis not present

## 2022-09-19 DIAGNOSIS — I25110 Atherosclerotic heart disease of native coronary artery with unstable angina pectoris: Secondary | ICD-10-CM | POA: Insufficient documentation

## 2022-09-19 DIAGNOSIS — F17210 Nicotine dependence, cigarettes, uncomplicated: Secondary | ICD-10-CM | POA: Insufficient documentation

## 2022-09-19 HISTORY — PX: LEFT HEART CATH AND CORONARY ANGIOGRAPHY: CATH118249

## 2022-09-19 HISTORY — DX: Hyperlipidemia, unspecified: E78.5

## 2022-09-19 SURGERY — LEFT HEART CATH AND CORONARY ANGIOGRAPHY
Anesthesia: Moderate Sedation

## 2022-09-19 SURGERY — LEFT HEART CATH AND CORONARY ANGIOGRAPHY
Anesthesia: Moderate Sedation | Laterality: Left

## 2022-09-19 MED ORDER — SODIUM CHLORIDE 0.9 % WEIGHT BASED INFUSION
3.0000 mL/kg/h | INTRAVENOUS | Status: AC
  Administered 2022-09-19: 3 mL/kg/h via INTRAVENOUS

## 2022-09-19 MED ORDER — SODIUM CHLORIDE 0.9% FLUSH: 3.0000 mL | INTRAVENOUS | Status: DC | PRN

## 2022-09-19 MED ORDER — SODIUM CHLORIDE 0.9% FLUSH: 3.0000 mL | Freq: Two times a day (BID) | INTRAVENOUS | Status: DC

## 2022-09-19 MED ORDER — MIDAZOLAM HCL 2 MG/2ML IJ SOLN
INTRAMUSCULAR | Status: AC
  Filled 2022-09-19: qty 2

## 2022-09-19 MED ORDER — VERAPAMIL HCL 2.5 MG/ML IV SOLN
INTRAVENOUS | Status: DC | PRN
  Administered 2022-09-19: 2.5 mg via INTRA_ARTERIAL

## 2022-09-19 MED ORDER — MIDAZOLAM HCL 2 MG/2ML IJ SOLN
INTRAMUSCULAR | Status: DC | PRN
  Administered 2022-09-19 (×2): 1 mg via INTRAVENOUS

## 2022-09-19 MED ORDER — HEPARIN (PORCINE) IN NACL 1000-0.9 UT/500ML-% IV SOLN
INTRAVENOUS | Status: DC | PRN
  Administered 2022-09-19: 1000 mL

## 2022-09-19 MED ORDER — SODIUM CHLORIDE 0.9 % IV SOLN: 250.0000 mL | INTRAVENOUS | Status: DC | PRN

## 2022-09-19 MED ORDER — ONDANSETRON HCL 4 MG/2ML IJ SOLN: 4.0000 mg | Freq: Four times a day (QID) | INTRAMUSCULAR | Status: DC | PRN

## 2022-09-19 MED ORDER — HEPARIN (PORCINE) IN NACL 1000-0.9 UT/500ML-% IV SOLN
INTRAVENOUS | Status: AC
  Filled 2022-09-19: qty 1000

## 2022-09-19 MED ORDER — FENTANYL CITRATE (PF) 100 MCG/2ML IJ SOLN
INTRAMUSCULAR | Status: DC | PRN
  Administered 2022-09-19: 25 ug via INTRAVENOUS

## 2022-09-19 MED ORDER — IOHEXOL 300 MG/ML  SOLN
INTRAMUSCULAR | Status: DC | PRN
  Administered 2022-09-19: 53 mL

## 2022-09-19 MED ORDER — FENTANYL CITRATE (PF) 100 MCG/2ML IJ SOLN
INTRAMUSCULAR | Status: AC
  Filled 2022-09-19: qty 2

## 2022-09-19 MED ORDER — SODIUM CHLORIDE 0.9 % IV SOLN: INTRAVENOUS | Status: DC

## 2022-09-19 MED ORDER — VERAPAMIL HCL 2.5 MG/ML IV SOLN
INTRAVENOUS | Status: AC
  Filled 2022-09-19: qty 2

## 2022-09-19 MED ORDER — HEPARIN SODIUM (PORCINE) 1000 UNIT/ML IJ SOLN
INTRAMUSCULAR | Status: DC | PRN
  Administered 2022-09-19: 3000 [IU] via INTRAVENOUS

## 2022-09-19 MED ORDER — ACETAMINOPHEN 325 MG PO TABS: 650.0000 mg | ORAL_TABLET | ORAL | Status: DC | PRN

## 2022-09-19 MED ORDER — SODIUM CHLORIDE 0.9 % WEIGHT BASED INFUSION
1.0000 mL/kg/h | INTRAVENOUS | Status: DC
  Administered 2022-09-19: 1 mL/kg/h via INTRAVENOUS

## 2022-09-19 MED ORDER — ASPIRIN 81 MG PO CHEW: 81.0000 mg | CHEWABLE_TABLET | ORAL | Status: DC

## 2022-09-19 MED ORDER — HEPARIN SODIUM (PORCINE) 1000 UNIT/ML IJ SOLN
INTRAMUSCULAR | Status: AC
  Filled 2022-09-19: qty 10

## 2022-09-19 SURGICAL SUPPLY — 11 items
BAND ZEPHYR COMPRESS 30 LONG (HEMOSTASIS) IMPLANT
CATH INFINITI 5FR JK (CATHETERS) IMPLANT
CATH INFINITI JR4 5F (CATHETERS) IMPLANT
DRAPE BRACHIAL (DRAPES) IMPLANT
GLIDESHEATH SLEND SS 6F .021 (SHEATH) IMPLANT
GUIDEWIRE INQWIRE 1.5J.035X260 (WIRE) IMPLANT
INQWIRE 1.5J .035X260CM (WIRE) ×1
PACK CARDIAC CATH (CUSTOM PROCEDURE TRAY) ×1 IMPLANT
PROTECTION STATION PRESSURIZED (MISCELLANEOUS) ×1
SET ATX SIMPLICITY (MISCELLANEOUS) IMPLANT
STATION PROTECTION PRESSURIZED (MISCELLANEOUS) IMPLANT

## 2022-09-19 NOTE — Progress Notes (Addendum)
PT stating numbness in R hand. See assessments. MD notified and verbal to continue to monitor for any worsening symptoms. PT in NAD  Update @ 1245: PT reports decreased numbness. Numbness now only in the thumb which is relieved by removal of pulse ox. MD updated. No new orders.

## 2022-09-19 NOTE — Interval H&P Note (Signed)
Cath Lab Visit (complete for each Cath Lab visit)  Clinical Evaluation Leading to the Procedure:   ACS: No.  Non-ACS:    Anginal Classification: CCS III  Anti-ischemic medical therapy: Minimal Therapy (1 class of medications)  Non-Invasive Test Results: No non-invasive testing performed  Prior CABG: No previous CABG      History and Physical Interval Note:  09/19/2022 10:04 AM  Alejandra Hall  has presented today for surgery, with the diagnosis of L Cath   Unstable angina.  The various methods of treatment have been discussed with the patient and family. After consideration of risks, benefits and other options for treatment, the patient has consented to  Procedure(s): LEFT HEART CATH AND CORONARY ANGIOGRAPHY (Left) as a surgical intervention.  The patient's history has been reviewed, patient examined, no change in status, stable for surgery.  I have reviewed the patient's chart and labs.  Questions were answered to the patient's satisfaction.     Lorine Bears

## 2022-09-24 ENCOUNTER — Encounter: Payer: Self-pay | Admitting: Cardiovascular Disease

## 2022-10-01 ENCOUNTER — Telehealth: Payer: Self-pay | Admitting: Cardiovascular Disease

## 2022-10-01 NOTE — Telephone Encounter (Signed)
Pt called back to report yesterday she had to leave work due to heartburn and feeling weak. Pt denied feeling like she was going to black out or pass out. No vitals to report and pt stated symptoms has since resolved.  Pt questioning if MD can write a work note for yesterday.  Nurse informed pt unfortunately without evaluating pt in person, MD wouldn't be able to provide work note. Nurse recommended pt contact pcp office. However, will forward below message to MD regarding Zetia.

## 2022-10-01 NOTE — Telephone Encounter (Signed)
Pt c/o medication issue:  1. Name of Medication: ezetimibe (ZETIA) 10 MG tablet   2. How are you currently taking this medication (dosage and times per day)? Last took yesterday. States they will not be taking again.   3. Are you having a reaction (difficulty breathing--STAT)? Yes.   4. What is your medication issue?  Pt states that medication started out making her itch and moved to nausea and overall not feeling well. Requesting to go back to med she was on previously.

## 2022-10-01 NOTE — Telephone Encounter (Signed)
Patient calling back. She says she also needs a doctors note for work. She says she also had heartburn and weakness yesterday. She says that was the reason she had to leave work.

## 2022-10-01 NOTE — Telephone Encounter (Signed)
Pt called to report since starting zetia, she's been experiencing itching, nausea, and diarrhea. Pt stated she has since decided to stop zetia and restart simvastatin.   Nurse informed pt recommendations were to take zetia in conjunction with rosuvastatin but pt stated she wasn't aware. However pt stated she will restart simvastatin.   Will forward message to Dr. Rockey Situ regarding Alejandra Hall

## 2022-10-03 NOTE — Telephone Encounter (Signed)
Pt made aware of MD's recommendations and verbalized understanding.   If having side effects on Zetia okay to stop Stay on the statin Thx TG

## 2022-11-10 ENCOUNTER — Encounter: Payer: Self-pay | Admitting: Ophthalmology

## 2022-11-12 NOTE — Discharge Instructions (Signed)

## 2022-11-13 ENCOUNTER — Other Ambulatory Visit: Payer: Self-pay

## 2022-11-13 ENCOUNTER — Encounter
Admission: RE | Disposition: A | Discharge status: Home / Self Care | Payer: Self-pay | Source: Home / Self Care | Attending: Ophthalmology

## 2022-11-13 ENCOUNTER — Ambulatory Visit: Payer: Medicare HMO | Admitting: Anesthesiology

## 2022-11-13 ENCOUNTER — Encounter: Payer: Self-pay | Admitting: Ophthalmology

## 2022-11-13 ENCOUNTER — Ambulatory Visit
Admission: RE | Admit: 2022-11-13 | Discharge: 2022-11-13 | Disposition: A | Discharge status: Home / Self Care | Payer: Medicare HMO | Attending: Ophthalmology | Admitting: Ophthalmology

## 2022-11-13 DIAGNOSIS — I252 Old myocardial infarction: Secondary | ICD-10-CM | POA: Insufficient documentation

## 2022-11-13 DIAGNOSIS — K5090 Crohn's disease, unspecified, without complications: Secondary | ICD-10-CM | POA: Insufficient documentation

## 2022-11-13 DIAGNOSIS — F17210 Nicotine dependence, cigarettes, uncomplicated: Secondary | ICD-10-CM | POA: Insufficient documentation

## 2022-11-13 DIAGNOSIS — Z8249 Family history of ischemic heart disease and other diseases of the circulatory system: Secondary | ICD-10-CM | POA: Insufficient documentation

## 2022-11-13 DIAGNOSIS — K219 Gastro-esophageal reflux disease without esophagitis: Secondary | ICD-10-CM | POA: Insufficient documentation

## 2022-11-13 DIAGNOSIS — Z955 Presence of coronary angioplasty implant and graft: Secondary | ICD-10-CM | POA: Insufficient documentation

## 2022-11-13 DIAGNOSIS — Z90711 Acquired absence of uterus with remaining cervical stump: Secondary | ICD-10-CM | POA: Insufficient documentation

## 2022-11-13 DIAGNOSIS — I2510 Atherosclerotic heart disease of native coronary artery without angina pectoris: Secondary | ICD-10-CM | POA: Diagnosis not present

## 2022-11-13 DIAGNOSIS — E1136 Type 2 diabetes mellitus with diabetic cataract: Secondary | ICD-10-CM | POA: Insufficient documentation

## 2022-11-13 DIAGNOSIS — E785 Hyperlipidemia, unspecified: Secondary | ICD-10-CM | POA: Diagnosis not present

## 2022-11-13 DIAGNOSIS — H2511 Age-related nuclear cataract, right eye: Secondary | ICD-10-CM | POA: Diagnosis not present

## 2022-11-13 DIAGNOSIS — J449 Chronic obstructive pulmonary disease, unspecified: Secondary | ICD-10-CM | POA: Diagnosis not present

## 2022-11-13 DIAGNOSIS — I10 Essential (primary) hypertension: Secondary | ICD-10-CM | POA: Diagnosis not present

## 2022-11-13 DIAGNOSIS — Z833 Family history of diabetes mellitus: Secondary | ICD-10-CM | POA: Insufficient documentation

## 2022-11-13 HISTORY — DX: Family history of other specified conditions: Z84.89

## 2022-11-13 HISTORY — PX: CATARACT EXTRACTION W/PHACO: SHX586

## 2022-11-13 HISTORY — DX: Presence of dental prosthetic device (complete) (partial): Z97.2

## 2022-11-13 HISTORY — DX: Gastro-esophageal reflux disease without esophagitis: K21.9

## 2022-11-13 SURGERY — PHACOEMULSIFICATION, CATARACT, WITH IOL INSERTION
Anesthesia: Monitor Anesthesia Care | Site: Eye | Laterality: Right

## 2022-11-13 MED ORDER — SIGHTPATH DOSE#1 NA HYALUR & NA CHOND-NA HYALUR IO KIT
PACK | INTRAOCULAR | Status: DC | PRN
  Administered 2022-11-13: 1 via OPHTHALMIC

## 2022-11-13 MED ORDER — MOXIFLOXACIN HCL 0.5 % OP SOLN
OPHTHALMIC | Status: DC | PRN
  Administered 2022-11-13: .2 mL via OPHTHALMIC

## 2022-11-13 MED ORDER — EPHEDRINE SULFATE (PRESSORS) 50 MG/ML IJ SOLN
INTRAMUSCULAR | Status: DC | PRN
  Administered 2022-11-13: 5 mg via INTRAVENOUS

## 2022-11-13 MED ORDER — PROPOFOL 10 MG/ML IV BOLUS
INTRAVENOUS | Status: DC | PRN
  Administered 2022-11-13 (×2): 20 ug via INTRAVENOUS

## 2022-11-13 MED ORDER — LIDOCAINE HCL (PF) 2 % IJ SOLN
INTRAOCULAR | Status: DC | PRN
  Administered 2022-11-13: 1 mL via INTRAOCULAR

## 2022-11-13 MED ORDER — ARMC OPHTHALMIC DILATING DROPS
1.0000 | OPHTHALMIC | Status: DC | PRN
  Administered 2022-11-13 (×3): 1 via OPHTHALMIC

## 2022-11-13 MED ORDER — FENTANYL CITRATE (PF) 100 MCG/2ML IJ SOLN
INTRAMUSCULAR | Status: DC | PRN
  Administered 2022-11-13: 50 ug via INTRAVENOUS

## 2022-11-13 MED ORDER — BRIMONIDINE TARTRATE-TIMOLOL 0.2-0.5 % OP SOLN
OPHTHALMIC | Status: DC | PRN
  Administered 2022-11-13: 1 [drp] via OPHTHALMIC

## 2022-11-13 MED ORDER — SIGHTPATH DOSE#1 BSS IO SOLN
INTRAOCULAR | Status: DC | PRN
  Administered 2022-11-13: 15 mL

## 2022-11-13 MED ORDER — SIGHTPATH DOSE#1 BSS IO SOLN
INTRAOCULAR | Status: DC | PRN
  Administered 2022-11-13: 82 mL via OPHTHALMIC

## 2022-11-13 MED ORDER — MIDAZOLAM HCL 2 MG/2ML IJ SOLN
INTRAMUSCULAR | Status: DC | PRN
  Administered 2022-11-13 (×2): 1 mg via INTRAVENOUS

## 2022-11-13 MED ORDER — TETRACAINE HCL 0.5 % OP SOLN
1.0000 [drp] | OPHTHALMIC | Status: DC | PRN
  Administered 2022-11-13 (×3): 1 [drp] via OPHTHALMIC

## 2022-11-13 SURGICAL SUPPLY — 12 items
CATARACT SUITE SIGHTPATH (MISCELLANEOUS) ×1 IMPLANT
DISSECTOR HYDRO NUCLEUS 50X22 (MISCELLANEOUS) ×1 IMPLANT
DRSG TEGADERM 2-3/8X2-3/4 SM (GAUZE/BANDAGES/DRESSINGS) ×1 IMPLANT
FEE CATARACT SUITE SIGHTPATH (MISCELLANEOUS) ×1 IMPLANT
GLOVE SURG SYN 7.5  E (GLOVE) ×1
GLOVE SURG SYN 7.5 E (GLOVE) ×1 IMPLANT
GLOVE SURG SYN 7.5 PF PI (GLOVE) ×1 IMPLANT
GLOVE SURG SYN 8.5  E (GLOVE) ×1
GLOVE SURG SYN 8.5 E (GLOVE) ×1 IMPLANT
GLOVE SURG SYN 8.5 PF PI (GLOVE) ×1 IMPLANT
LENS IOL TECNIS EYHANCE 17.5 (Intraocular Lens) IMPLANT
WATER STERILE IRR 250ML POUR (IV SOLUTION) ×1 IMPLANT

## 2022-11-13 NOTE — Anesthesia Preprocedure Evaluation (Addendum)
Anesthesia Evaluation  Patient identified by MRN, date of birth, ID band Patient awake    Reviewed: Allergy & Precautions, H&P , NPO status , Patient's Chart, lab work & pertinent test results, reviewed documented beta blocker date and time   Airway Mallampati: II  TM Distance: >3 FB Neck ROM: full    Dental  (+) Edentulous Upper, Edentulous Lower   Pulmonary COPD,  COPD inhaler, Current Smoker   Pulmonary exam normal        Cardiovascular hypertension, (-) angina + CAD, + Past MI and + Cardiac Stents  Normal cardiovascular exam  LHC 12/23: 1.  Widely patent LAD and RCA stents with mild in-stent restenosis.  Chronically occluded OM 3 with collaterals known from 2015 .  Progression of second diagonal disease and moderate ostial RCA stenosis before the stented segment. 2.  Normal LV systolic function normal left ventricular end-diastolic pressure.    Neuro/Psych negative neurological ROS  negative psych ROS   GI/Hepatic Neg liver ROS,GERD  ,,  Endo/Other  negative endocrine ROS    Renal/GU negative Renal ROS  negative genitourinary   Musculoskeletal   Abdominal   Peds  Hematology negative hematology ROS (+)   Anesthesia Other Findings Past Medical History: 11/24/2013: Borderline type 2 diabetes mellitus No date: CAD (coronary artery disease)     Comment:  a. 10/2013 s/p inferior STEMI/PCI: LAD (3.5x12 Promus               DES), RCA (2.5x38 Promus DES & 2.75x16 Promus DES), OM2               100 in inf branch w/ L->L collats;  b. 04/2015 Ex MV: no               ischemia. Test stopped due to severe c/p (injected w/               Ss);  c. 04/2015 Cath: LM 10, LAD 30ost, patent stent, D1               50ost, 30 diff, LCX nl, OM2 100 CTO of inferior branch               with L-L collats (2.35m vessel->Med Rx), RCA 10ost ISR.  No date: COPD (chronic obstructive pulmonary disease) ([facility] 11/24/2013: Crohn's disease ([facility] No  date: Essential hypertension No date: Family history of adverse reaction to anesthesia No date: GERD (gastroesophageal reflux disease) No date: Hyperlipidemia 10/2013: Myocardial infarction ([facility]     Comment:  Non-STEMI No date: Tobacco abuse No date: Wears dentures     Comment:  Has full upper and lower.  Doesn't always wear.  Past Surgical History: No date: APPENDECTOMY No date: CARDIAC CATHETERIZATION     Comment:  s/p inferior STEMI; s/p PCI to LAD and RCA 05/10/2015: CARDIAC CATHETERIZATION; N/A     Comment:  Procedure: Left Heart Cath and Coronary Angiography;                Surgeon: TMinna Merritts MD;  [facility];  Service: Cardiovascular;  Laterality: N/A; No date: CESAREAN SECTION     Comment:  X 2 No date: CORONARY ANGIOPLASTY     Comment:  s/p inferior STEMI; s/p PCI to LAD and RCA 11/25/2013: FRACTIONAL FLOW RESERVE WIRE; Right     Comment:  [facility]  Surgeon:  Jettie Booze, MD;  Location: [facility] CATH LAB;                Service: Cardiovascular;  Laterality: Right; 09/19/2022: LEFT HEART CATH AND CORONARY ANGIOGRAPHY; Left     Comment:  Procedure: LEFT HEART CATH AND CORONARY ANGIOGRAPHY;                Surgeon: Wellington Hampshire, MD;  Location: [place]               CV LAB;  Service: Cardiovascular;  Laterality: Left; 11/25/2013: LEFT HEART CATHETERIZATION WITH CORONARY ANGIOGRAM; N/A     Comment:  Procedure: LEFT HEART CATHETERIZATION WITH CORONARY               ANGIOGRAM;  Surgeon: Jettie Booze, MD;  Location:               [facility] CATH LAB;  Service: Cardiovascular;  Laterality: N/A; No date: PARTIAL HYSTERECTOMY 11/25/2013: PERCUTANEOUS CORONARY STENT INTERVENTION (PCI-S)     Comment:  Procedure: PERCUTANEOUS CORONARY STENT INTERVENTION               (PCI-S);  Surgeon: Jettie Booze, MD;  Location: [facility]              CATH LAB;  Service: Cardiovascular;;  BMI    Body  Mass Index: 24.55 kg/m      Reproductive/Obstetrics negative OB ROS                              Anesthesia Physical Anesthesia Plan  ASA: 3  Anesthesia Plan: MAC   Post-op Pain Management: Minimal or no pain anticipated   Induction: Intravenous  PONV Risk Score and Plan: Propofol infusion and TIVA  Airway Management Planned: Natural Airway  Additional Equipment:   Intra-op Plan:   Post-operative Plan:   Informed Consent: I have reviewed the patients History and Physical, chart, labs and discussed the procedure including the risks, benefits and alternatives for the proposed anesthesia with the patient or authorized representative who has indicated his/her understanding and acceptance.       Plan Discussed with: CRNA and Surgeon  Anesthesia Plan Comments:          Anesthesia Quick Evaluation

## 2022-11-13 NOTE — Transfer of Care (Signed)
Immediate Anesthesia Transfer of Care Note  Patient: Alejandra Hall  Procedure(s) Performed: CATARACT EXTRACTION PHACO AND INTRAOCULAR LENS PLACEMENT (IOC) RIGHT  9.93  01:03.5 (Right: Eye)  Patient Location: PACU  Anesthesia Type: MAC  Level of Consciousness: awake, alert  and patient cooperative  Airway and Oxygen Therapy: Patient Spontanous Breathing and Patient connected to supplemental oxygen  Post-op Assessment: Post-op Vital signs reviewed, Patient's Cardiovascular Status Stable, Respiratory Function Stable, Patent Airway and No signs of Nausea or vomiting  Post-op Vital Signs: Reviewed and stable  Complications: No notable events documented.

## 2022-11-13 NOTE — Anesthesia Procedure Notes (Signed)
Procedure Name: MAC Date/Time: 11/13/2022 12:00 PM  Performed by: Hilbert Odor, CRNAPre-anesthesia Checklist: Patient identified, Emergency Drugs available, Suction available, Patient being monitored and Timeout performed Patient Re-evaluated:Patient Re-evaluated prior to induction Oxygen Delivery Method: Nasal cannula Induction Type: IV induction

## 2022-11-13 NOTE — Op Note (Signed)
OPERATIVE NOTE  MYAR VANDERHEYDEN [id number] 11/13/2022   PREOPERATIVE DIAGNOSIS: Nuclear sclerotic cataract right eye. H25.11   POSTOPERATIVE DIAGNOSIS: Nuclear sclerotic cataract right eye. H25.11   PROCEDURE:  Phacoemusification with posterior chamber intraocular lens placement of the right eye  Ultrasound time: Procedure(s): CATARACT EXTRACTION PHACO AND INTRAOCULAR LENS PLACEMENT (IOC) RIGHT  9.93  01:03.5 (Right)  LENS:   Implant Name Type Inv. Item Serial No. Manufacturer Lot No. LRB No. Used Action  LENS IOL TECNIS EYHANCE 17.5 - [id number] Intraocular Lens LENS IOL TECNIS EYHANCE 17.5 [id number] [employer]  Right 1 Implanted      SURGEON:  Courtney Heys. Lazarus Salines, MD   ANESTHESIA:  Topical with tetracaine drops, augmented with 1% preservative-free intracameral lidocaine.   COMPLICATIONS:  None.   DESCRIPTION OF PROCEDURE:  The patient was identified in the holding room and transported to the operating room and placed in the supine position under the operating microscope.  The right eye was identified as the operative eye, which was prepped and draped in the usual sterile ophthalmic fashion.   A 1 millimeter clear-corneal paracentesis was made superotemporally. Preservative-free 1% lidocaine mixed with 1:1,000 bisulfite-free aqueous solution of epinephrine was injected into the anterior chamber. The anterior chamber was then filled with Viscoat viscoelastic. A 2.4 millimeter keratome was used to make a clear-corneal incision inferotemporally. A curvilinear capsulorrhexis was made with a cystotome and capsulorrhexis forceps. Balanced salt solution was used to hydrodissect and hydrodelineate the nucleus. Phacoemulsification was then used to remove the lens nucleus and epinucleus. The remaining cortex was then removed using the irrigation and aspiration handpiece. Provisc was then placed into the capsular bag to distend it for lens placement. A +17.50 D DIB00 intraocular lens was then injected  into the capsular bag. The remaining viscoelastic was aspirated.   Wounds were hydrated with balanced salt solution.  The anterior chamber was inflated to a physiologic pressure with balanced salt solution.  No wound leaks were noted. Vigamox was injected intracamerally.  Timolol and Brimonidine drops were applied to the eye.  The patient was taken to the recovery room in stable condition without complications of anesthesia or surgery.  Maryann Alar [place] 11/13/2022, 12:38 PM

## 2022-11-13 NOTE — Addendum Note (Signed)
Addendum  created 11/13/22 1339 by Hilbert Odor, CRNA   Child order released for a procedure order, Clinical Note Signed, Intraprocedure Blocks edited, SmartForm saved

## 2022-11-13 NOTE — H&P (Signed)
[facility]   Primary Care Physician:  [facility] [place] Ophthalmologist: Dr. Merleen Nicely  Pre-Procedure History & Physical: HPI:  Alejandra Hall is a 62 y.o. female here for cataract surgery.   Past Medical History:  Diagnosis Date   Borderline type 2 diabetes mellitus 11/24/2013   CAD (coronary artery disease)    a. 10/2013 s/p inferior STEMI/PCI: LAD (3.5x12 Promus DES), RCA (2.5x38 Promus DES & 2.75x16 Promus DES), OM2 100 in inf branch w/ L->L collats;  b. 04/2015 Ex MV: no ischemia. Test stopped due to severe c/p (injected w/ Ss);  c. 04/2015 Cath: LM 10, LAD 30ost, patent stent, D1 50ost, 30 diff, LCX nl, OM2 100 CTO of inferior branch with L-L collats (2.40m vessel->Med Rx), RCA 10ost ISR.    COPD (chronic obstructive pulmonary disease) (HCC)    Crohn's disease ([facility] 11/24/2013   Essential hypertension    Family history of adverse reaction to anesthesia    GERD (gastroesophageal reflux disease)    Hyperlipidemia    Myocardial infarction ([place] 10/2013   Non-STEMI   Tobacco abuse    Wears dentures    Has full upper and lower.  Doesn't always wear.    Past Surgical History:  Procedure Laterality Date   APPENDECTOMY     CARDIAC CATHETERIZATION     s/p inferior STEMI; s/p PCI to LAD and RCA   CARDIAC CATHETERIZATION N/A 05/10/2015   Procedure: Left Heart Cath and Coronary Angiography;  Surgeon: TMinna Merritts MD;  [facility];  Service: Cardiovascular;  Laterality: N/A;   CESAREAN SECTION     X 2   CORONARY ANGIOPLASTY     s/p inferior STEMI; s/p PCI to LAD and RCA   FRACTIONAL FLOW RESERVE WIRE Right 11/25/2013   Procedure: FRACTIONAL FLOW RESERVE WIRE;  Surgeon: JJettie Booze MD;  [facility];  Service: Cardiovascular;  Laterality: Right;   LEFT HEART CATH AND CORONARY ANGIOGRAPHY Left 09/19/2022   Procedure: LEFT HEART CATH AND CORONARY ANGIOGRAPHY;  Surgeon: AWellington Hampshire MD;  [facility];   Service: Cardiovascular;  Laterality: Left;   LEFT HEART CATHETERIZATION WITH CORONARY ANGIOGRAM N/A 11/25/2013   Procedure: LEFT HEART CATHETERIZATION WITH CORONARY ANGIOGRAM;  Surgeon: JJettie Booze MD;  [facility];  Service: Cardiovascular;  Laterality: N/A;   PARTIAL HYSTERECTOMY     PERCUTANEOUS CORONARY STENT INTERVENTION (PCI-S)  11/25/2013   Procedure: PERCUTANEOUS CORONARY STENT INTERVENTION (PCI-S);  Surgeon: JJettie Booze MD;  [facility];  Service: Cardiovascular;;    Prior to Admission medications   Medication Sig Start Date End Date Taking? Authorizing Provider  ADVAIR DISKUS 250-50 MCG/DOSE AEPB Inhale 1 puff into the lungs as needed.  03/31/15  Yes [provider]  Ascorbic Acid (VITAMIN C PO) Take by mouth.   Yes [provider]  carvedilol (COREG) 3.125 MG tablet TAKE 1/2 TABLET BY MOUTH TWICE DAILY WITH A MEAL 09/16/22  Yes Gollan, TKathlene November MD  clopidogrel (PLAVIX) 75 MG tablet Take 1 tablet (75 mg total) by mouth daily. 09/16/22  Yes Gollan, TKathlene November MD  pantoprazole (PROTONIX) 20 MG tablet Take 20 mg by mouth as needed. 04/05/15  Yes [provider]  PROAIR HFA 108 (90 BASE) MCG/ACT inhaler Inhale 1 puff into the lungs as needed. 02/24/15  Yes [provider]  simvastatin (ZOCOR) 40 MG tablet Take 1 tablet (40 mg total) by mouth at bedtime. 09/16/22  Yes Gollan,  Kathlene November, MD  vitamin B-12 (CYANOCOBALAMIN) 500 MCG tablet Take 500 mcg by mouth daily.   Yes [provider]  nitroGLYCERIN (NITROSTAT) 0.4 MG SL tablet Place under the tongue. Patient not taking: Reported on 09/16/2022 01/12/17   [provider]    Allergies as of 10/14/2022 - Review Complete 09/19/2022  Allergen Reaction Noted   Metronidazole Nausea Only 04/11/2015    Family History  Problem Relation Age of Onset   Hypertension Mother    Diabetes Mother    Stroke Maternal Grandfather     Social History    Socioeconomic History   Marital status: Married    Spouse name: Not on file   Number of children: Not on file   Years of education: Not on file   Highest education level: Not on file  Occupational History   Not on file  Tobacco Use   Smoking status: Every Day    Packs/day: 0.25    Years: 53.00    Total pack years: 13.25    Types: Cigarettes   Smokeless tobacco: Never   Tobacco comments:    Started smoking age 59  Vaping Use   Vaping Use: Never used  Substance and Sexual Activity   Alcohol use: Not Currently   Drug use: No   Sexual activity: Not on file  Other Topics Concern   Not on file  Social History Narrative   Not on file   Social Determinants of Health   Financial Resource Strain: Not on file  Food Insecurity: Not on file  Transportation Needs: Not on file  Physical Activity: Not on file  Stress: Not on file  Social Connections: Not on file  Intimate Partner Violence: Not on file    Review of Systems: See HPI, otherwise negative ROS  Physical Exam: Ht 5' 4"$  (1.626 m)   Wt 64.9 kg   BMI 24.55 kg/m  General:   Alert, cooperative in NAD Head:  Normocephalic and atraumatic. Respiratory:  Normal work of breathing. Cardiovascular:  RRR  Impression/Plan: Alejandra Hall is here for cataract surgery.  Risks, benefits, limitations, and alternatives regarding cataract surgery have been reviewed with the patient.  Questions have been answered.  All parties agreeable.   Norvel Richards, MD  11/13/2022, 10:52 AM

## 2022-11-13 NOTE — Anesthesia Postprocedure Evaluation (Signed)
Anesthesia Post Note  Patient: Alejandra Hall  Procedure(s) Performed: CATARACT EXTRACTION PHACO AND INTRAOCULAR LENS PLACEMENT (IOC) RIGHT  9.93  01:03.5 (Right: Eye)  Patient location during evaluation: PACU Anesthesia Type: MAC Level of consciousness: awake and alert Pain management: pain level controlled Vital Signs Assessment: post-procedure vital signs reviewed and stable Respiratory status: spontaneous breathing, nonlabored ventilation and respiratory function stable Cardiovascular status: blood pressure returned to baseline and stable Postop Assessment: no apparent nausea or vomiting Anesthetic complications: no   No notable events documented.   Last Vitals:  Vitals:   11/13/22 1239 11/13/22 1246  BP: 92/70 104/72  Pulse: (!) 105 (!) 101  Resp: 18 15  Temp: 36.4 C 36.4 C  SpO2: 95% 97%    Last Pain:  Vitals:   11/13/22 1246  TempSrc:   PainSc: 0-No pain                 [place] Ouch

## 2022-11-14 ENCOUNTER — Encounter: Payer: Self-pay | Admitting: Ophthalmology

## 2022-11-16 NOTE — Progress Notes (Unsigned)
Cardiology Office Note  Date:  11/17/2022   ID:  Alejandra Hall, DOB 09/26/1961, MRN [id number]  PCP:  Center, [facility]   Chief Complaint  Patient presents with   2 month s/p cardiac cath     "Doing well." Medications reviewed by the patient verbally.     HPI:  Alejandra Hall is a pleasant 62 year old woman with a long history of  smoking who continues to smoke,  currently unemployed,   anxiety,  CAD, non-ST elevation MI,  transferred to [place] from [facility] with LAD and RCA stent placement 10/2013 EF at that time 40 to 45% Stress test 04/2015, had angina sx, EF 53% Cardiac catheterization July 2016, medical management recommended Occluded inferior branch of the OM 3 with collateral filling (likely the reason for her anginal symptoms). Managed by the [facility] clinic She presents for routine followup of her coronary artery disease  Last seen by myself in clinic in December 2023 On that visit reported having worsening anginal symptoms Cardiac Cath 12/23 1.  Widely patent LAD and RCA stents with mild in-stent restenosis.  Chronically occluded OM 3 with collaterals known from 2015 .  Progression of second diagonal disease and moderate ostial RCA stenosis before the stented segment. 2.  Normal LV systolic function normal left ventricular end-diastolic pressure.   Second diagonal is not suitable for PCI due to diffuse disease and extension into the ostium.  The vessel appears to be at most 2 mm in diameter.  In follow-up today denies significant chest pain symptoms Tired today, stays up late, sleep disorder Sleep deprived she feels No significant shortness of breath on exertion, continues to smoke No leg swelling, no PND orthopnea  [facility] lab work reviewed  total cholesterol 168 LDL 90 on simvastatin 40 daily She did not tolerate Zetia  No longer taking carvedilol secondary to hypotension and orthostasis   EKG personally reviewed by myself on todays visit shows  normal sinus rhythm with rate 90 b 9 pm, no significant ST or T-wave changes  Past medical history reviewed cardiac catheterization 04/2015:  Patent stent in the proximal LAD and ostial/proximal RCA Occluded inferior branch of the OM 3 with collateral filling (likely the reason for her anginal symptoms). The inferior branch that is occluded from the proximal to mid region is small in caliber him on likely 2.0 mm or less.   Recommendation was made for medical management given the small size of the vessel, likely chronically occluded, now with collaterals from left to left, some right to left collaterals as well.    Previously seen by GI/anesthesia at [facility]. Colonoscopy was canceled by the patient's report secondary to her chest discomfort. Previous chest pain relieved with albuterol.    arthritis in the mornings.  teeth pulled prior to her last clinic visit, stopped her effient for several weeks.   Lab work reviewed with her showing total cholesterol  172, LDL 99 in September 2015. Elevated white blood cell count of 21 in October 2015 that returned down to normal December 2015. CRP of 2   She had stuttering pains with worsening pains at the end of February 2015 during a snowstorm. She was driving from [facility] to [place] when she developed chest pain, arm pain, jaw pain. she was given thrombolytics, transferred to [place] where she underwent cardiac catheterization. She had DES placed to her LAD and RCA.  Peak troponin the hospital was for 4.0 on 11/24/2013.      PMH:   has  a past medical history of Borderline type 2 diabetes mellitus (11/24/2013), CAD (coronary artery disease), COPD (chronic obstructive pulmonary disease) ([place]), Crohn's disease ([place]) (11/24/2013), Essential hypertension, Family history of adverse reaction to anesthesia, GERD (gastroesophageal reflux disease), Hyperlipidemia, Myocardial infarction ([place]) (10/2013), Tobacco abuse, and Wears dentures.  PSH:    Past  Surgical History:  Procedure Laterality Date   APPENDECTOMY     CARDIAC CATHETERIZATION     s/p inferior STEMI; s/p PCI to LAD and RCA   CARDIAC CATHETERIZATION N/A 05/10/2015   Procedure: Left Heart Cath and Coronary Angiography;  Surgeon: Minna Merritts, MD;  Location: [place] CV LAB;  Service: Cardiovascular;  Laterality: N/A;   CATARACT EXTRACTION W/PHACO Right 11/13/2022   Procedure: CATARACT EXTRACTION PHACO AND INTRAOCULAR LENS PLACEMENT ([place]) RIGHT  9.93  01:03.5;  Surgeon: Norvel Richards, MD;  Location: [place];  Service: Ophthalmology;  Laterality: Right;   CESAREAN SECTION     X 2   CORONARY ANGIOPLASTY     s/p inferior STEMI; s/p PCI to LAD and RCA   FRACTIONAL FLOW RESERVE WIRE Right 11/25/2013   Procedure: FRACTIONAL FLOW RESERVE WIRE;  Surgeon: Jettie Booze, MD;  Location: [facility] CATH LAB;  Service: Cardiovascular;  Laterality: Right;   LEFT HEART CATH AND CORONARY ANGIOGRAPHY Left 09/19/2022   Procedure: LEFT HEART CATH AND CORONARY ANGIOGRAPHY;  Surgeon: Wellington Hampshire, MD;  Location: [place] CV LAB;  Service: Cardiovascular;  Laterality: Left;   LEFT HEART CATHETERIZATION WITH CORONARY ANGIOGRAM N/A 11/25/2013   Procedure: LEFT HEART CATHETERIZATION WITH CORONARY ANGIOGRAM;  Surgeon: Jettie Booze, MD;  Location: [facility] CATH LAB;  Service: Cardiovascular;  Laterality: N/A;   PARTIAL HYSTERECTOMY     PERCUTANEOUS CORONARY STENT INTERVENTION (PCI-S)  11/25/2013   Procedure: PERCUTANEOUS CORONARY STENT INTERVENTION (PCI-S);  Surgeon: Jettie Booze, MD;  Location: [facility] CATH LAB;  Service: Cardiovascular;;    Current Outpatient Medications  Medication Sig Dispense Refill   ADVAIR DISKUS 250-50 MCG/DOSE AEPB Inhale 1 puff into the lungs as needed.      Ascorbic Acid (VITAMIN C PO) Take by mouth.     aspirin EC 81 MG tablet Take 81 mg by mouth daily. Swallow whole.     clopidogrel (PLAVIX) 75 MG tablet Take 1 tablet (75 mg total) by  mouth daily. 90 tablet 3   nitroGLYCERIN (NITROSTAT) 0.4 MG SL tablet Place under the tongue.     pantoprazole (PROTONIX) 20 MG tablet Take 20 mg by mouth as needed.     PROAIR HFA 108 (90 BASE) MCG/ACT inhaler Inhale 1 puff into the lungs as needed.     simvastatin (ZOCOR) 40 MG tablet Take 1 tablet (40 mg total) by mouth at bedtime. 90 tablet 3   vitamin B-12 (CYANOCOBALAMIN) 500 MCG tablet Take 500 mcg by mouth daily.     carvedilol (COREG) 3.125 MG tablet TAKE 1/2 TABLET BY MOUTH TWICE DAILY WITH A MEAL (Patient not taking: Reported on 11/17/2022) 90 tablet 3   No current facility-administered medications for this visit.     Allergies:   Metronidazole   Social History:  The patient  reports that she has been smoking cigarettes. She has a 13.25 pack-year smoking history. She has never used smokeless tobacco. She reports that she does not currently use alcohol. She reports that she does not use drugs.   Family History:   family history includes Diabetes in her mother; Hypertension in her mother; Stroke in her maternal grandfather.  Review of Systems: Review of Systems  Constitutional: Negative.   HENT: Negative.    Respiratory: Negative.    Cardiovascular:  Positive for chest pain.  Gastrointestinal: Negative.   Musculoskeletal: Negative.   Neurological: Negative.   Psychiatric/Behavioral: Negative.    All other systems reviewed and are negative.   PHYSICAL EXAM: VS:  BP 100/62 (BP Location: Left Arm, Patient Position: Sitting, Cuff Size: Normal)   Pulse 90   Ht 5' 4"$  (1.626 m)   Wt 146 lb 2 oz (66.3 kg)   SpO2 96%   BMI 25.08 kg/m  , BMI Body mass index is 25.08 kg/m. Constitutional:  oriented to person, place, and time. No distress.  HENT:  Head: Grossly normal Eyes:  no discharge. No scleral icterus.  Neck: No JVD, no carotid bruits  Cardiovascular: Regular rate and rhythm, no murmurs appreciated Pulmonary/Chest: Clear to auscultation bilaterally, no wheezes or  rails Abdominal: Soft.  no distension.  no tenderness.  Musculoskeletal: Normal range of motion Neurological:  normal muscle tone. Coordination normal. No atrophy Skin: Skin warm and dry Psychiatric: normal affect, pleasant  Recent Labs: 12/05/2021: ALT 11 09/16/2022: BUN 16; Creatinine, Ser 0.66; Hemoglobin 11.9; Platelets 287; Potassium 3.6; Sodium 140    Lipid Panel Lab Results  Component Value Date   CHOL 151 11/25/2013   CHOL 148 11/25/2013   HDL 31 (L) 11/25/2013   HDL 30 (L) 11/25/2013   LDLCALC 73 11/25/2013   LDLCALC 71 11/25/2013   TRIG 234 (H) 11/25/2013   TRIG 236 (H) 11/25/2013      Wt Readings from Last 3 Encounters:  11/17/22 146 lb 2 oz (66.3 kg)  11/13/22 143 lb (64.9 kg)  09/19/22 143 lb (64.9 kg)     ASSESSMENT AND PLAN:   Coronary artery disease involving native coronary artery of native heart without angina pectoris - Cardiac catheterization performed last month, medical management recommended Stressed importance of smoking cessation, continue statin Off beta-blocker secondary to hypotension  Essential hypertension  BP chronically low, off coreg Orthostasis symptoms reported  Smoker -  We have encouraged her to continue to work on weaning her cigarettes and smoking cessation. She will continue to work on this and does not want any assistance with chantix.    History of coronary artery stent placement  On asa and plavix, mild stent and restenosis Still smoking, cessation recommended Continue simvastatin, Side effects on zetia, itching Could consider PCSK9 inhibitor   Total encounter time more than 30 minutes  Greater than 50% was spent in counseling and coordination of care with the patient   Orders Placed This Encounter  Procedures   EKG 12-Lead     Signed, Esmond Plants, M.D., Ph.D. 11/17/2022  [place], [place]

## 2022-11-17 ENCOUNTER — Encounter: Payer: Self-pay | Admitting: Cardiovascular Disease

## 2022-11-17 ENCOUNTER — Ambulatory Visit: Payer: Medicare HMO | Attending: Cardiovascular Disease | Admitting: Cardiovascular Disease

## 2022-11-17 VITALS — BP 100/62 | HR 90 | Ht 64.0 in | Wt 146.1 lb

## 2022-11-17 DIAGNOSIS — I25118 Atherosclerotic heart disease of native coronary artery with other forms of angina pectoris: Secondary | ICD-10-CM

## 2022-11-17 DIAGNOSIS — E782 Mixed hyperlipidemia: Secondary | ICD-10-CM

## 2022-11-17 DIAGNOSIS — I10 Essential (primary) hypertension: Secondary | ICD-10-CM

## 2022-11-17 DIAGNOSIS — R7303 Prediabetes: Secondary | ICD-10-CM

## 2022-11-17 DIAGNOSIS — F17200 Nicotine dependence, unspecified, uncomplicated: Secondary | ICD-10-CM | POA: Diagnosis not present

## 2022-11-17 MED ORDER — CLOPIDOGREL BISULFATE 75 MG PO TABS: 75.0000 mg | ORAL_TABLET | Freq: Every day | ORAL | 3 refills | Status: DC

## 2022-11-17 MED ORDER — SIMVASTATIN 40 MG PO TABS: 40.0000 mg | ORAL_TABLET | Freq: Every day | ORAL | 3 refills | Status: DC

## 2022-11-17 NOTE — Patient Instructions (Signed)
Medication Instructions:  No changes  If you need a refill on your cardiac medications before your next appointment, please call your pharmacy.   Lab work: No new labs needed  Testing/Procedures: No new testing needed  Follow-Up: At [facility], you and your health needs are our priority.  As part of our continuing mission to provide you with exceptional heart care, we have created designated Provider Care Teams.  These Care Teams include your primary Cardiologist (physician) and Advanced Practice Providers (APPs -  Physician Assistants and Nurse Practitioners) who all work together to provide you with the care you need, when you need it.  You will need a follow up appointment in 12 months  Providers on your designated Care Team:   Christopher Berge, NP Ryan Dunn, PA-C Cadence Furth, PA-C  COVID-19 Vaccine Information can be found at: [email or web address] For questions related to vaccine distribution or appointments, please email [email or web address] or call [phone].   

## 2022-12-04 ENCOUNTER — Ambulatory Visit: Admit: 2022-12-04 | Payer: Medicare HMO | Admitting: Ophthalmology

## 2022-12-04 SURGERY — PHACOEMULSIFICATION, CATARACT, WITH IOL INSERTION
Anesthesia: Topical | Laterality: Left

## 2023-09-17 ENCOUNTER — Telehealth: Payer: Self-pay | Admitting: Cardiovascular Disease

## 2023-09-17 NOTE — Telephone Encounter (Signed)
Pt dropped off forms to be completed by doctor for her insurance company, pertaining to the patient's medical condition. Forms placed in Dr. Windell Hummingbird nurse box.

## 2023-09-18 NOTE — Telephone Encounter (Signed)
Forms completed and patient notified

## 2023-09-24 NOTE — Telephone Encounter (Signed)
Patient came by to pick up [facility] forms that were completed and faxed successfully.

## 2023-11-30 ENCOUNTER — Other Ambulatory Visit: Payer: Self-pay | Admitting: Cardiovascular Disease

## 2023-12-04 ENCOUNTER — Other Ambulatory Visit: Payer: Self-pay | Admitting: Cardiovascular Disease

## 2023-12-13 ENCOUNTER — Other Ambulatory Visit: Payer: Self-pay | Admitting: Cardiovascular Disease

## 2024-02-08 NOTE — Progress Notes (Unsigned)
 Cardiology Office Note  Date:  02/09/2024   ID:  Alejandra Hall, DOB 07/13/1961, MRN [id number]  PCP:  Center, [facility]   Chief Complaint  Patient presents with   12 month follow up     Patient is ready to stop smoking and would like to discuss Chantix.     HPI:  Alejandra Hall is a pleasant 63 year old woman with a long history of  smoking who continues to smoke,  currently unemployed,   anxiety,  CAD, non-ST elevation MI,  transferred to [place] from [facility] with LAD and RCA stent placement 10/2013 EF at that time 40 to 45% Stress test 04/2015, had angina sx, EF 53% Cardiac catheterization July 2016, medical management recommended Occluded inferior branch of the OM 3 with collateral filling (likely the reason for her anginal symptoms). Managed by the [facility] clinic She presents for routine followup of her coronary artery disease  Last seen by myself in clinic in 2/24 Reports she continues to smoke up to 1 pack/day Stress at home contributing to her smoking Husband quit, daughter who stays with her also smokes Does not have enough money to afford Chantix Rolls her own cigarettes, cheaper  Denies significant chest pain concerning for angina Compliant with her simvastatin   EKG personally reviewed by myself on todays visit EKG Interpretation Date/Time:  Tuesday Feb 09 2024 16:15:52 EDT Ventricular Rate:  90 PR Interval:  132 QRS Duration:  86 QT Interval:  360 QTC Calculation: 440 R Axis:   44  Text Interpretation: Normal sinus rhythm Normal ECG When compared with ECG of 05-Dec-2021 15:32, No significant change was found Confirmed by Belva Boyden [phone]) on 02/09/2024 4:51:22 PM    Cardiac Cath 12/23 1.  Widely patent LAD and RCA stents with mild in-stent restenosis.  Chronically occluded OM 3 with collaterals known from 2015 .  Progression of second diagonal disease and moderate ostial RCA stenosis before the stented segment. 2.  Normal LV systolic function  normal left ventricular end-diastolic pressure.   Second diagonal is not suitable for PCI due to diffuse disease and extension into the ostium.  The vessel appears to be at most 2 mm in diameter.  Past medical history reviewed cardiac catheterization 04/2015:  Patent stent in the proximal LAD and ostial/proximal RCA Occluded inferior branch of the OM 3 with collateral filling (likely the reason for her anginal symptoms). The inferior branch that is occluded from the proximal to mid region is small in caliber him on likely 2.0 mm or less.   Recommendation was made for medical management given the small size of the vessel, likely chronically occluded, now with collaterals from left to left, some right to left collaterals as well.    Previously seen by GI/anesthesia at [place]. Colonoscopy was canceled by the patient's report secondary to her chest discomfort. Previous chest pain relieved with albuterol.    arthritis in the mornings.  teeth pulled prior to her last clinic visit, stopped her effient  for several weeks.   Lab work reviewed with her showing total cholesterol  172, LDL 99 in September 2015. Elevated white blood cell count of 21 in October 2015 that returned down to normal December 2015. CRP of 2   She had stuttering pains with worsening pains at the end of February 2015 during a snowstorm. She was driving from [facility] to [place] when she developed chest pain, arm pain, jaw pain. she was given thrombolytics, transferred to [place] where she underwent cardiac catheterization. She had  DES placed to her LAD and RCA.  Peak troponin the hospital was for 4.0 on 11/24/2013.      PMH:   has a past medical history of Borderline type 2 diabetes mellitus (11/24/2013), CAD (coronary artery disease), COPD (chronic obstructive pulmonary disease) (H[facility]), Crohn's disease ([facility]) (11/24/2013), Essential hypertension, Family history of adverse reaction to anesthesia, GERD (gastroesophageal reflux  disease), Hyperlipidemia, Myocardial infarction ([facility]) (10/2013), Tobacco abuse, and Wears dentures.  PSH:    Past Surgical History:  Procedure Laterality Date   APPENDECTOMY     CARDIAC CATHETERIZATION     s/p inferior STEMI; s/p PCI to LAD and RCA   CARDIAC CATHETERIZATION N/A 05/10/2015   Procedure: Left Heart Cath and Coronary Angiography;  Surgeon: Devorah Fonder, MD;  Location: [facility] INVASIVE CV LAB;  Service: Cardiovascular;  Laterality: N/A;   CATARACT EXTRACTION W/PHACO Right 11/13/2022   Procedure: CATARACT EXTRACTION PHACO AND INTRAOCULAR LENS PLACEMENT (I[facility]) RIGHT  9.93  01:03.5;  Surgeon: Trudi Fus, MD;  Location: [facility] SURGERY [facility]TR;  Service: Ophthalmology;  Laterality: Right;   CESAREAN SECTION     X 2   CORONARY ANGIOPLASTY     s/p inferior STEMI; s/p PCI to LAD and RCA   FRACTIONAL FLOW RESERVE WIRE Right 11/25/2013   Procedure: FRACTIONAL FLOW RESERVE WIRE;  Surgeon: Lucendia Rusk, MD;  Location: [facility] CATH LAB;  Service: Cardiovascular;  Laterality: Right;   LEFT HEART CATH AND CORONARY ANGIOGRAPHY Left 09/19/2022   Procedure: LEFT HEART CATH AND CORONARY ANGIOGRAPHY;  Surgeon: Wenona Hamilton, MD;  Location: [facility] INVASIVE CV LAB;  Service: Cardiovascular;  Laterality: Left;   LEFT HEART CATHETERIZATION WITH CORONARY ANGIOGRAM N/A 11/25/2013   Procedure: LEFT HEART CATHETERIZATION WITH CORONARY ANGIOGRAM;  Surgeon: Lucendia Rusk, MD;  Location: [facility] CATH LAB;  Service: Cardiovascular;  Laterality: N/A;   PARTIAL HYSTERECTOMY     PERCUTANEOUS CORONARY STENT INTERVENTION (PCI-S)  11/25/2013   Procedure: PERCUTANEOUS CORONARY STENT INTERVENTION (PCI-S);  Surgeon: Lucendia Rusk, MD;  Location: [facility] CATH LAB;  Service: Cardiovascular;;    Current Outpatient Medications  Medication Sig Dispense Refill   ADVAIR DISKUS 250-50 MCG/DOSE AEPB Inhale 1 puff into the lungs as needed.      Ascorbic Acid (VITAMIN C PO) Take by mouth.     aspirin  EC 81 MG  tablet Take 81 mg by mouth daily. Swallow whole.     carvedilol  (COREG ) 3.125 MG tablet TAKE 1/2 TABLET TWICE DAILY WITH MEALS 90 tablet 0   clopidogrel  (PLAVIX ) 75 MG tablet TAKE 1 TABLET EVERY DAY 90 tablet 0   nitroGLYCERIN  (NITROSTAT ) 0.4 MG SL tablet Place under the tongue.     pantoprazole  (PROTONIX ) 20 MG tablet Take 20 mg by mouth as needed.     PROAIR HFA 108 (90 BASE) MCG/ACT inhaler Inhale 1 puff into the lungs as needed.     simvastatin  (ZOCOR ) 40 MG tablet TAKE 1 TABLET BY MOUTH EVERYDAY AT BEDTIME 90 tablet 3   vitamin B-12 (CYANOCOBALAMIN) 500 MCG tablet Take 500 mcg by mouth daily.     No current facility-administered medications for this visit.    Allergies:   Metronidazole   Social History:  The patient  reports that she has been smoking cigarettes. She has a 13.3 pack-year smoking history. She has never used smokeless tobacco. She reports that she does not currently use alcohol. She reports that she does not use drugs.   Family History:   family history includes Diabetes in her mother; Hypertension  in her mother; Stroke in her maternal grandfather.   Review of Systems: Review of Systems  Constitutional: Negative.   HENT: Negative.    Respiratory: Negative.    Cardiovascular: Negative.   Gastrointestinal: Negative.   Musculoskeletal: Negative.   Neurological: Negative.   Psychiatric/Behavioral: Negative.    All other systems reviewed and are negative.  PHYSICAL EXAM: VS:  BP 100/60 (BP Location: Left Arm, Patient Position: Sitting, Cuff Size: Normal)   Pulse 90   Ht 5\' 4"  (1.626 m)   Wt 146 lb (66.2 kg)   SpO2 94%   BMI 25.06 kg/m  , BMI Body mass index is 25.06 kg/m. Constitutional:  oriented to person, place, and time. No distress.  HENT:  Head: Grossly normal Eyes:  no discharge. No scleral icterus.  Neck: No JVD, no carotid bruits  Cardiovascular: Regular rate and rhythm, no murmurs appreciated Pulmonary/Chest: Clear to auscultation bilaterally, no  wheezes or rails Abdominal: Soft.  no distension.  no tenderness.  Musculoskeletal: Normal range of motion Neurological:  normal muscle tone. Coordination normal. No atrophy Skin: Skin warm and dry Psychiatric: normal affect, pleasant  Recent Labs: No results found for requested labs within last 365 days.   Lipid Panel Lab Results  Component Value Date   CHOL 151 11/25/2013   CHOL 148 11/25/2013   HDL 31 (L) 11/25/2013   HDL 30 (L) 11/25/2013   LDLCALC 73 11/25/2013   LDLCALC 71 11/25/2013   TRIG 234 (H) 11/25/2013   TRIG 236 (H) 11/25/2013      Wt Readings from Last 3 Encounters:  02/09/24 146 lb (66.2 kg)  11/17/22 146 lb 2 oz (66.3 kg)  11/13/22 143 lb (64.9 kg)     ASSESSMENT AND PLAN:  Coronary artery disease involving native coronary artery of native heart without angina pectoris - Cardiac catheterization with recommendation for medical management recommended Off beta-blocker secondary to hypotension Again reiterated the importance of smoking cessation She reports she is unable to afford Chantix  Essential hypertension  BP chronically low, off coreg  Denies significant orthostasis symptoms  Smoker -  We have encouraged her to continue to work on weaning her cigarettes and smoking cessation. She will continue to work on this We will send in prescription for Chantix to [place] clinic  History of coronary artery stent placement  On asa and plavix , mild stent  restenosis Still smoking, cessation recommended Continue simvastatin , Side effects on zetia , itching    Orders Placed This Encounter  Procedures   EKG 12-Lead     Signed, Juanda Noon, M.D., Ph.D. 02/09/2024  [facility] [place], [place] [phone]

## 2024-02-09 ENCOUNTER — Encounter: Payer: Self-pay | Admitting: Cardiovascular Disease

## 2024-02-09 ENCOUNTER — Ambulatory Visit: Attending: Cardiovascular Disease | Admitting: Cardiovascular Disease

## 2024-02-09 VITALS — BP 100/60 | HR 90 | Ht 64.0 in | Wt 146.0 lb

## 2024-02-09 DIAGNOSIS — I25110 Atherosclerotic heart disease of native coronary artery with unstable angina pectoris: Secondary | ICD-10-CM

## 2024-02-09 DIAGNOSIS — I200 Unstable angina: Secondary | ICD-10-CM

## 2024-02-09 DIAGNOSIS — I25118 Atherosclerotic heart disease of native coronary artery with other forms of angina pectoris: Secondary | ICD-10-CM

## 2024-02-09 DIAGNOSIS — I10 Essential (primary) hypertension: Secondary | ICD-10-CM

## 2024-02-09 DIAGNOSIS — E782 Mixed hyperlipidemia: Secondary | ICD-10-CM

## 2024-02-09 DIAGNOSIS — R7303 Prediabetes: Secondary | ICD-10-CM

## 2024-02-09 DIAGNOSIS — F17200 Nicotine dependence, unspecified, uncomplicated: Secondary | ICD-10-CM

## 2024-02-09 MED ORDER — VARENICLINE TARTRATE 1 MG PO TABS: 1.0000 mg | ORAL_TABLET | Freq: Two times a day (BID) | ORAL | 6 refills | Status: AC

## 2024-02-09 MED ORDER — SIMVASTATIN 40 MG PO TABS: 40.0000 mg | ORAL_TABLET | Freq: Every day | ORAL | 3 refills | Status: DC

## 2024-02-09 MED ORDER — CARVEDILOL 3.125 MG PO TABS: ORAL_TABLET | ORAL | 3 refills | Status: DC

## 2024-02-09 MED ORDER — CLOPIDOGREL BISULFATE 75 MG PO TABS: 75.0000 mg | ORAL_TABLET | Freq: Every day | ORAL | 3 refills | Status: DC

## 2024-02-09 NOTE — Patient Instructions (Signed)

## 2024-02-15 ENCOUNTER — Other Ambulatory Visit: Payer: Self-pay | Admitting: Cardiovascular Disease

## 2024-04-07 ENCOUNTER — Telehealth: Payer: Self-pay | Admitting: Cardiovascular Disease

## 2024-04-07 NOTE — Telephone Encounter (Signed)
 Patient dropped off [employer] paperwork to be filled out, placed in box.

## 2024-04-08 NOTE — Telephone Encounter (Signed)
 Called patient and notified her that form has been completed and placed in office for pick up. Patient verbalizes understanding.

## 2024-05-06 ENCOUNTER — Other Ambulatory Visit: Payer: Self-pay

## 2024-05-06 MED ORDER — CLOPIDOGREL BISULFATE 75 MG PO TABS: 75.0000 mg | ORAL_TABLET | Freq: Every day | ORAL | 2 refills | Status: AC

## 2024-05-11 ENCOUNTER — Other Ambulatory Visit: Payer: Self-pay

## 2024-05-11 MED ORDER — SIMVASTATIN 40 MG PO TABS: 40.0000 mg | ORAL_TABLET | Freq: Every day | ORAL | 2 refills | Status: AC

## 2024-08-24 ENCOUNTER — Ambulatory Visit
Admission: RE | Admit: 2024-08-24 | Discharge: 2024-08-24 | Disposition: A | Discharge status: Home / Self Care | Source: Ambulatory Visit | Attending: Family Medicine | Admitting: Family Medicine

## 2024-08-24 ENCOUNTER — Other Ambulatory Visit: Payer: Self-pay | Admitting: Family Medicine

## 2024-08-24 DIAGNOSIS — M25552 Pain in left hip: Secondary | ICD-10-CM | POA: Insufficient documentation
